# Patient Record
Sex: Female | Born: 1965 | Race: White | Hispanic: No | Marital: Married | Smoking: Former smoker
Health system: Southern US, Community
[De-identification: ages and names within clinical notes are randomized; demographics above are authoritative.]

## PROBLEM LIST (undated history)

## (undated) DIAGNOSIS — G4733 Obstructive sleep apnea (adult) (pediatric): Secondary | ICD-10-CM

## (undated) DIAGNOSIS — F329 Major depressive disorder, single episode, unspecified: Secondary | ICD-10-CM

## (undated) DIAGNOSIS — M25473 Effusion, unspecified ankle: Secondary | ICD-10-CM

## (undated) DIAGNOSIS — F32A Depression, unspecified: Secondary | ICD-10-CM

## (undated) DIAGNOSIS — F419 Anxiety disorder, unspecified: Secondary | ICD-10-CM

## (undated) DIAGNOSIS — Z87442 Personal history of urinary calculi: Secondary | ICD-10-CM

## (undated) DIAGNOSIS — K219 Gastro-esophageal reflux disease without esophagitis: Secondary | ICD-10-CM

## (undated) DIAGNOSIS — M810 Age-related osteoporosis without current pathological fracture: Secondary | ICD-10-CM

## (undated) DIAGNOSIS — E039 Hypothyroidism, unspecified: Secondary | ICD-10-CM

## (undated) DIAGNOSIS — I10 Essential (primary) hypertension: Secondary | ICD-10-CM

## (undated) DIAGNOSIS — E669 Obesity, unspecified: Secondary | ICD-10-CM

## (undated) HISTORY — PX: SALIVARY STONE REMOVAL: SHX5213

## (undated) HISTORY — PX: CHOLECYSTECTOMY: SHX55

## (undated) HISTORY — DX: Depression, unspecified: F32.A

## (undated) HISTORY — DX: Obesity, unspecified: E66.9

## (undated) HISTORY — DX: Essential (primary) hypertension: I10

## (undated) HISTORY — PX: DILATION AND CURETTAGE OF UTERUS: SHX78

## (undated) HISTORY — PX: DIAGNOSTIC LAPAROSCOPY: SUR761

## (undated) HISTORY — DX: Gastro-esophageal reflux disease without esophagitis: K21.9

## (undated) HISTORY — PX: TUBAL LIGATION: SHX77

---
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
## 2019-11-08 ENCOUNTER — Other Ambulatory Visit: Payer: Self-pay | Admitting: General Surgery

## 2019-11-08 ENCOUNTER — Other Ambulatory Visit (HOSPITAL_COMMUNITY): Payer: Self-pay | Admitting: General Surgery

## 2019-11-15 ENCOUNTER — Encounter: Payer: Self-pay | Admitting: Neurology

## 2019-11-15 ENCOUNTER — Other Ambulatory Visit: Payer: Self-pay

## 2019-11-15 ENCOUNTER — Ambulatory Visit: Payer: Commercial Managed Care - PPO | Admitting: Neurology

## 2019-11-15 VITALS — BP 134/86 | HR 75 | Temp 97.1°F | Ht 63.5 in | Wt 302.0 lb

## 2019-11-15 DIAGNOSIS — R351 Nocturia: Secondary | ICD-10-CM | POA: Diagnosis not present

## 2019-11-15 DIAGNOSIS — Z9189 Other specified personal risk factors, not elsewhere classified: Secondary | ICD-10-CM | POA: Diagnosis not present

## 2019-11-15 DIAGNOSIS — R0683 Snoring: Secondary | ICD-10-CM

## 2019-11-15 DIAGNOSIS — Z6841 Body Mass Index (BMI) 40.0 and over, adult: Secondary | ICD-10-CM

## 2019-11-15 NOTE — Progress Notes (Signed)
Subjective:    Patient ID: Heidi Pace is a 54 y.o. female.  HPI     Huston Foley, MD, PhD [facility] Heidi Pace [address] [address] [place], [place] [address]  Dear Dr. Sheliah Pace, I saw your patient, Heidi Pace, upon your kind request in my sleep clinic today for initial consultation of her sleep disorder, in particular, concern for underlying obstructive sleep apnea.  The patient is unaccompanied today.  As you know, Heidi Pace is a 54 year old right-handed woman with an underlying medical history of hypertension, arthritis, cholelithiasis with status post laparoscopic cholecystectomy, hypothyroidism, acid reflux disease, depression, and morbid obesity with a BMI of over 50, who reports snoring and excessive daytime somnolence.  She is being evaluated for bariatric surgery.  Her Epworth sleepiness score is 5 out of 24 today, fatigue severity score is 33 out of 63.  I reviewed your office note from 11/01/2019.  She is being evaluated for bariatric surgery.  She reports a bedtime between 1030 and 11, rise time around 630.  She works in the office, as a Occupational psychologist.  She quit smoking over 20 years ago and drinks alcohol in the form of wine, 1 or 2 times a week.  She does not drink daily caffeine.  She lives with her husband and her daughter.  They have 1 dog in the household, typically the dog sleeps on the bed with them, she does not have a TV in the bedroom.  She denies morning headaches, she denies a family history of sleep apnea, she has nocturia at least once per average night.  Weight has been plateaued.  She has not had a tonsillectomy and does not report any symptoms of restless leg syndrome.  She wakes up reasonably well rested she feels.  She does not typically take any naps except for very rarely.  Her Past Medical History Is Significant For: Past Medical History:  Diagnosis Date  . Acid reflux   . Depression   . Hypertension   .  Hypertension     Her Past Surgical History Is Significant For: Past Surgical History:  Procedure Laterality Date  . CESAREAN SECTION    . CHOLECYSTECTOMY    . DILATION AND CURETTAGE OF UTERUS    . SALIVARY STONE REMOVAL    . TUBAL LIGATION      Her Family History Is Significant For: No family history on file.  Her Social History Is Significant For: Social History   Socioeconomic History  . Marital status: Married    Spouse name: Trey Paula   . Number of children: 1  . Years of education: Not on file  . Highest education level: Associate degree: academic program  Occupational History  . Not on file  Tobacco Use  . Smoking status: Former Smoker    Types: Cigarettes    Quit date: 2000    Years since quitting: 21.1  . Smokeless tobacco: Never Used  Substance and Sexual Activity  . Alcohol use: Yes    Comment: Wine/2 glasses per week  . Drug use: Never  . Sexual activity: Not on file  Other Topics Concern  . Not on file  Social History Narrative  . Not on file   Social Determinants of Health   Financial Resource Strain:   . Difficulty of Paying Living Expenses: Not on file  Food Insecurity:   . Worried About Programme researcher, broadcasting/film/video in the Last Year: Not on file  . Ran Out of Food  in the Last Year: Not on file  Transportation Needs:   . Lack of Transportation (Medical): Not on file  . Lack of Transportation (Non-Medical): Not on file  Physical Activity:   . Days of Exercise per Week: Not on file  . Minutes of Exercise per Session: Not on file  Stress:   . Feeling of Stress : Not on file  Social Connections:   . Frequency of Communication with Friends and Family: Not on file  . Frequency of Social Gatherings with Friends and Family: Not on file  . Attends Religious Services: Not on file  . Active Member of Clubs or Organizations: Not on file  . Attends Banker Meetings: Not on file  . Marital Status: Not on file    Her Allergies Are:  No Known  Allergies:   Her Current Medications Are:  Outpatient Encounter Medications as of 11/15/2019  Medication Sig  . Ascorbic Acid (VITAMIN C PO) Take by mouth.  . ASPIRIN 81 PO Take by mouth.  Marland Kitchen BIOTIN PO Take by mouth.  Marland Kitchen buPROPion (WELLBUTRIN XL) 150 MG 24 hr tablet Take 150 mg by mouth daily.  . Calcium Carb-Cholecalciferol (CALCIUM/VITAMIN D PO) Take 600 mg by mouth.  Marland Kitchen FLUoxetine (PROZAC) 20 MG capsule Take 20 mg by mouth daily.  Marland Kitchen levothyroxine (SYNTHROID) 25 MCG tablet Take 25 mcg by mouth daily.  Marland Kitchen lisinopril-hydrochlorothiazide (ZESTORETIC) 10-12.5 MG tablet Take 1 tablet by mouth daily.  . Multiple Vitamin (MULTIVITAMIN) tablet Take 1 tablet by mouth daily.  Marland Kitchen omeprazole (PRILOSEC) 20 MG capsule Take 20 mg by mouth daily.   No facility-administered encounter medications on file as of 11/15/2019.  :  Review of Systems:  Out of a complete 14 point review of systems, all are reviewed and negative with the exception of these symptoms as listed below: Review of Systems  Neurological:       Here for sleep consult. No prior sleep study, snoring is present.   Epworth Sleepiness Scale 0= would never doze 1= slight chance of dozing 2= moderate chance of dozing 3= high chance of dozing  Sitting and reading:0 Watching TV:1 Sitting inactive in a public place (ex. Theater or meeting):0 As a passenger in a car for an hour without a break:1 Lying down to rest in the afternoon:3 Sitting and talking to someone:0 Sitting quietly after lunch (no alcohol):0 In a car, while stopped in traffic:0 Total:5     Objective:  Neurological Exam  Physical Exam Physical Examination:   Vitals:   11/15/19 0852  BP: 134/86  Pulse: 75  Temp: (!) 97.1 F (36.2 C)  SpO2: 98%    General Examination: The patient is a very pleasant 54 y.o. female in no acute distress. She appears well-developed and well-nourished and well groomed.   HEENT: Normocephalic, atraumatic, pupils are equal, round and  reactive to light, extraocular tracking is good without limitation to gaze excursion or nystagmus noted. Hearing is grossly intact. Face is symmetric with normal facial animation. Speech is clear with no dysarthria noted. There is no hypophonia. There is no lip, neck/head, jaw or voice tremor. Neck is supple with full range of passive and active motion. There are no carotid bruits on auscultation. Oropharynx exam reveals: mild mouth dryness, adequate dental hygiene and mild airway crowding, due to Smaller airway entry, tonsils are on the smaller side, uvula smaller as well, Mallampati class II, neck circumference 16-1/8 inches.  Tongue protrudes centrally in palate elevates symmetrically.  She has a mild  overbite.  Chest: Clear to auscultation without wheezing, rhonchi or crackles noted.  Heart: S1+S2+0, regular and normal without murmurs, rubs or gallops noted.   Abdomen: Soft, non-tender and non-distended with normal bowel sounds appreciated on auscultation.  Extremities: There is trace pitting L leg.   Skin: Warm and dry without trophic changes noted.   Musculoskeletal: exam reveals L knee pain.   Neurologically:  Mental status: The patient is awake, alert and oriented in all 4 spheres. Her immediate and remote memory, attention, language skills and fund of knowledge are appropriate. There is no evidence of aphasia, agnosia, apraxia or anomia. Speech is clear with normal prosody and enunciation. Thought process is linear. Mood is normal and affect is normal.  Cranial nerves II - XII are as described above under HEENT exam.  Motor exam: Normal bulk, strength and tone is noted. There is no tremor, Romberg is negative. Reflexes are 1+ throughout, toes are down going b/l. Fine motor skills and coordination: grossly intact.  Cerebellar testing: No dysmetria or intention tremor. There is no truncal or gait ataxia.  Sensory exam: intact to light touch in the upper and lower extremities.  Gait,  station and balance: She stands easily. No veering to one side is noted. No leaning to one side is noted. Posture is age-appropriate and stance is narrow based. Gait shows normal stride length and normal pace. No problems turning are noted. L side limp.   Assessment and Plan:  In summary, CERIA SUMINSKI is a very pleasant 54 y.o.-year old female with an underlying medical history of hypertension, arthritis, cholelithiasis with status post laparoscopic cholecystectomy, hypothyroidism, acid reflux disease, depression, and morbid obesity with a BMI of over 50, whose history and physical exam are concerning for obstructive sleep apnea (OSA). I had a long chat with the patient about my findings and the diagnosis of OSA, its prognosis and treatment options. We talked about medical treatments, surgical interventions and non-pharmacological approaches. I explained in particular the risks and ramifications of untreated moderate to severe OSA, especially with respect to developing cardiovascular disease down the Road, including congestive heart failure, difficult to treat hypertension, cardiac arrhythmias, or stroke. Even type 2 diabetes has, in part, been linked to untreated OSA. Symptoms of untreated OSA include daytime sleepiness, memory problems, mood irritability and mood disorder such as depression and anxiety, lack of energy, as well as recurrent headaches, especially morning headaches. We talked about trying to maintain a healthy lifestyle in general, as well as the importance of weight control. We also talked about the importance of good sleep hygiene. I recommended the following at this time: sleep study.   I explained the sleep test procedure to the patient and also outlined possible surgical and non-surgical treatment options of OSA, including the use of a custom-made dental device (which would require a referral to a specialist dentist or oral surgeon), upper airway surgical options, such as traditional  UPPP or a novel less invasive surgical option in the form of Inspire hypoglossal nerve stimulation (which would involve a referral to an ENT surgeon). I also explained the CPAP or autoPAP treatment option to the patient, who indicated that she would be willing to try CPAP if the need arises. I explained the importance of being compliant with PAP treatment, not only for insurance purposes but primarily to improve Her symptoms, and for the patient's long term health benefit, including to reduce Her cardiovascular risks. I answered all her questions today and the patient was in agreement. I  plan to see her back after the sleep study is completed and encouraged her to call with any interim questions, concerns, problems or updates.   Thank you very much for allowing me to participate in the care of this nice patient. If I can be of any further assistance to you please do not hesitate to call me at [phone].  Sincerely,   Huston Foley, MD, PhD

## 2019-11-15 NOTE — Patient Instructions (Signed)

## 2019-11-27 ENCOUNTER — Other Ambulatory Visit: Payer: Self-pay | Admitting: General Surgery

## 2019-11-27 ENCOUNTER — Other Ambulatory Visit (HOSPITAL_COMMUNITY): Payer: Self-pay | Admitting: General Surgery

## 2019-11-27 ENCOUNTER — Ambulatory Visit (INDEPENDENT_AMBULATORY_CARE_PROVIDER_SITE_OTHER): Payer: Commercial Managed Care - PPO | Admitting: Neurology

## 2019-11-27 DIAGNOSIS — R351 Nocturia: Secondary | ICD-10-CM

## 2019-11-27 DIAGNOSIS — Z9189 Other specified personal risk factors, not elsewhere classified: Secondary | ICD-10-CM

## 2019-11-27 DIAGNOSIS — R0683 Snoring: Secondary | ICD-10-CM

## 2019-11-27 DIAGNOSIS — G4720 Circadian rhythm sleep disorder, unspecified type: Secondary | ICD-10-CM

## 2019-11-27 DIAGNOSIS — G4733 Obstructive sleep apnea (adult) (pediatric): Secondary | ICD-10-CM

## 2019-11-30 ENCOUNTER — Encounter: Payer: Self-pay | Admitting: Dietician

## 2019-11-30 ENCOUNTER — Encounter: Payer: Commercial Managed Care - PPO | Attending: General Surgery | Admitting: Dietician

## 2019-11-30 ENCOUNTER — Other Ambulatory Visit: Payer: Self-pay

## 2019-11-30 DIAGNOSIS — E669 Obesity, unspecified: Secondary | ICD-10-CM | POA: Diagnosis present

## 2019-11-30 NOTE — Progress Notes (Signed)
Nutrition Assessment for Bariatric Surgery Medical Nutrition Therapy  Appt Start Time: 9:45am    End Time: 10:45am  Patient was seen on 11/30/2019 for Pre-Operative Nutrition Assessment. Letter of approval faxed to [facility] bariatric surgery program coordinator on 11/30/2019.   Referral stated Supervised Weight Loss (SWL) visits needed: 0  Planned surgery: RYGB Pt expectation of surgery: to lose enough weight to qualify for knee surgery, to be healthier  Pt expectation of dietitian: to learn how to eat healty   NUTRITION ASSESSMENT   Anthropometrics  Start weight at [facility]: 299 lbs (date: 11/30/2019) Height: 63.5 in BMI: 52.1 kg/m2     Lifestyle & Dietary Hx Patient lives with her husband and daughter. Daughter has egg and nut allergies, will keep nuts out of the house. Typical meal pattern is 3 meals plus maybe 1 snack per day. Will pack Lean Cuisine for lunch daily at work. May have a sandwich for lunch if at home. Will have fish with mac & cheese and baked beans, or maybe tacos for a meal. Eat out 0-2 times per week.     24-Hr Dietary Recall First Meal: Dannon light and fit yogurt + [place] Oats & Honey granola  Snack: - Second Meal: Lean Cuisine spaghetti + orange Snack: oatmeal cream pie  Third Meal: tacos (meat, lettuce, tomato, taco shells, cheese)  Snack: -  Beverages: water  Estimated Energy Needs Calories: 1500 Carbohydrate: 170g Protein: 94g Fat: 50g   NUTRITION DIAGNOSIS  Overweight/obesity (Branch-3.3) related to past poor dietary habits and physical inactivity as evidenced by patient w/ planned RYGB surgery following dietary guidelines for continued weight loss.    NUTRITION INTERVENTION  Nutrition counseling (C-1) and education (E-2) to facilitate bariatric surgery goals.  Pre-Op Goals Reviewed with the Patient . Track food and beverage intake (pen and paper, MyFitness Pal, Baritastic app, etc.) . Make healthy food choices while monitoring  portion sizes . Consume 3 meals per day or try to eat every 3-5 hours . Avoid concentrated sugars and fried foods . Keep sugar & fat in the single digits per serving on food labels . Practice CHEWING your food (aim for applesauce consistency) . Practice not drinking 15 minutes before, during, and 30 minutes after each meal and snack . Avoid all carbonated beverages (ex: soda, sparkling beverages)  . Limit caffeinated beverages (ex: coffee, tea, energy drinks) . Avoid all sugar-sweetened beverages (ex: regular soda, sports drinks)  . Avoid alcohol  . Aim for 64-100 ounces of FLUID daily (with at least half of fluid intake being plain water)  . Aim for at least 60-80 grams of PROTEIN daily . Look for a liquid protein source that contains ?15 g protein and ?5 g carbohydrate (ex: shakes, drinks, shots) . Make a list of non-food related activities . Physical activity is an important part of a healthy lifestyle so keep it moving! The goal is to reach 150 minutes of exercise per week, including cardiovascular and weight baring activity.  *Goals that are bolded indicate the pt would like to start working towards these  Handouts Provided Include  . Bariatric Surgery handouts (Nutrition Visits, Pre-Op Goals, Protein Shakes, Vitamins & Minerals)  Learning Style & Readiness for Change Teaching method utilized: Visual & Auditory  Demonstrated degree of understanding via: Teach Back  Barriers to learning/adherence to lifestyle change: None Identified    MONITORING & EVALUATION Dietary intake, weekly physical activity, body weight, and pre-op goals reached at next nutrition visit.   Next Steps Patient  is to follow up at [place] for Pre-Op Class (>2 weeks before surgery) for further nutrition education.

## 2019-12-05 ENCOUNTER — Ambulatory Visit (HOSPITAL_COMMUNITY)
Admission: RE | Admit: 2019-12-05 | Discharge: 2019-12-05 | Disposition: A | Discharge status: Home / Self Care | Payer: Commercial Managed Care - PPO | Source: Ambulatory Visit | Attending: General Surgery | Admitting: General Surgery

## 2019-12-05 ENCOUNTER — Other Ambulatory Visit: Payer: Self-pay

## 2019-12-08 NOTE — Procedures (Signed)
PATIENT'S NAME:  Heidi Pace, Heidi Pace DOB:      September 24, 1966      MR#:    [id number]     DATE OF RECORDING: 11/27/2019 REFERRING M.D.:  De Blanch Kinsinger, MD Study Performed:   Baseline Polysomnogram HISTORY: 54 year old woman with a history of hypertension, arthritis, cholelithiasis with status post laparoscopic cholecystectomy, hypothyroidism, acid reflux disease, depression, and morbid obesity with a BMI of over 50, who reports snoring and excessive daytime somnolence.  She is being evaluated for bariatric surgery. The patient endorsed the Epworth Sleepiness Scale at 5/24 points. The patient's weight 302 pounds with a height of 64 (inches), resulting in a BMI of 52.9 kg/m2. The patient's neck circumference measured 16.2 inches.  CURRENT MEDICATIONS: Vitamin C, Aspirin, Biotin, Wellbutrin XL, cholecalciferol, Prozac, Synthroid, Zestoretic, Multivitamin, Prilosec.   PROCEDURE:  This is a multichannel digital polysomnogram utilizing the Somnostar 11.2 system.  Electrodes and sensors were applied and monitored per AASM Specifications.   EEG, EOG, Chin and Limb EMG, were sampled at 200 Hz.  ECG, Snore and Nasal Pressure, Thermal Airflow, Respiratory Effort, CPAP Flow and Pressure, Oximetry was sampled at 50 Hz. Digital video and audio were recorded.      BASELINE STUDY  Lights Out was at 21:55 and Lights On at 05:03.  Total recording time (TRT) was 428.5 minutes, with a total sleep time (TST) of 353 minutes.   The patient's sleep latency to persistent sleep was 56.5 minutes, which is delayed.  REM latency was 333 minutes, which is markedly delayed. The sleep efficiency was 82.4 %.     SLEEP ARCHITECTURE: WASO (Wake after sleep onset) was 47.5 minutes with mild sleep fragmentation noted.  There were 7.5 minutes in Stage N1, 181 minutes Stage N2, 100.5 minutes Stage N3 and 64 minutes in Stage REM.  The percentage of Stage N1 was 2.1%, Stage N2 was 51.3%, which is normal, Stage N3 was 28.5%, which is  increased, and Stage R (REM sleep) was 18.1%, which is near normal. The arousals were noted as: 38 were spontaneous, 13 were associated with PLMs, 14 were associated with respiratory events.  RESPIRATORY ANALYSIS:  There were a total of 69 respiratory events:  1 obstructive apneas, 1 central apneas and 0 mixed apneas with a total of 2 apneas and an apnea index (AI) of .3 /hour. There were 67 hypopneas with a hypopnea index of 11.4 /hour. The patient also had 0 respiratory event related arousals (RERAs).      The total APNEA/HYPOPNEA INDEX (AHI) was 11.7/hour and the total RESPIRATORY DISTURBANCE INDEX was  11.7 /hour.  53 events occurred in REM sleep and 30 events in NREM. The REM AHI was  49.7 /hour, versus a non-REM AHI of 3.3. The patient spent 90 minutes of total sleep time in the supine position and 263 minutes in non-supine.. The supine AHI was 6.0 versus a non-supine AHI of 13.7.  OXYGEN SATURATION & C02:  The Wake baseline 02 saturation was 92%, with the lowest being 79%. Time spent below 89% saturation equaled 136 minutes. PERIODIC LIMB MOVEMENTS:   The patient had a total of 35 Periodic Limb Movements.  The Periodic Limb Movement (PLM) index was 5.9 and the PLM Arousal index was 2.2/hour.  Audio and video analysis did not show any abnormal or unusual movements, behaviors, phonations or vocalizations. The patient took no bathroom breaks. Mild to moderate snoring was noted. The EKG was in keeping with normal sinus rhythm (NSR).  Post-study, the patient indicated that sleep  was the same as usual.   IMPRESSION:  1. Obstructive Sleep Apnea (OSA) 2. Dysfunctions associated with sleep stages or arousal from sleep  RECOMMENDATIONS:  1. This study demonstrates overall mild obstructive sleep apnea, severe in REM sleep with a total AHI of 11.7/hour, REM AHI of 49.7/hour, and O2 nadir of 79%. The absence of supine REM sleep may underestimate her AHI and O2 nadir. Given the patient's medical  history and sleep related complaints, as well as planned bariatric surgery, treatment with positive airway pressure is recommended; this can be achieved in the form of autoPAP. Alternatively, a full-night CPAP titration study would allow optimization of therapy if needed. Other treatment options may include avoidance of supine sleep position along with weight loss, upper airway or jaw surgery in selected patients or the use of an oral appliance in certain patients. ENT evaluation and/or consultation with a maxillofacial surgeon or dentist may be feasible in some instances.    2. Please note that untreated obstructive sleep apnea may carry additional perioperative morbidity. Patients with significant obstructive sleep apnea should receive perioperative PAP therapy and the surgeons and particularly the anesthesiologist should be informed of the diagnosis and the severity of the sleep disordered breathing. 3. This study shows mild sleep fragmentation and mildly abnormal sleep stage percentages; these are nonspecific findings and per se do not signify an intrinsic sleep disorder or a cause for the patient's sleep-related symptoms. Causes include (but are not limited to) the first night effect of the sleep study, circadian rhythm disturbances, medication effect or an underlying mood disorder or medical problem.  4. The patient should be cautioned not to drive, work at heights, or operate dangerous or heavy equipment when tired or sleepy. Review and reiteration of good sleep hygiene measures should be pursued with any patient. 5. The patient will be seen in follow-up by Dr. Rexene Alberts at [facility] for discussion of the test results and further management strategies. The referring provider will be notified of the test results.  I certify that I have reviewed the entire raw data recording prior to the issuance of this report in accordance with the Standards of Accreditation of the American Academy of Sleep Medicine  (AASM)  Star Age, MD, PhD Diplomat, American Board of Neurology and Sleep Medicine (Neurology and Sleep Medicine)

## 2019-12-08 NOTE — Progress Notes (Signed)
Patient referred by Dr. Sheliah Hatch, seen by me on 11/15/19, PSG on 11/27/19.    Please call and notify the patient that the recent sleep study showed obstructive sleep apnea. OSA is overall mild, but in the severe range when she is in REM/dream sleep. In light of planned weight loss surgery, treatment with a positive airway pressure machine is a reasonable choice and may be temporary in her case (may not need treatment after successful weight loss with time). To that end, I recommend treatment of her OSA in the form of autoPAP, which means, that we don't have to bring her in for a sleep study with CPAP, but will let her try an autoPAP machine at home, through a DME company (of her choice, or as per insurance requirement). The DME representative will educate her on how to use the machine, how to put the mask on, etc. I have placed an order in the chart. Please remind patient about the importance of compliance with treatment (which is an insurance requirement), the need for a timely follow-up appointment, and the importance of cleanliness with the device and its parts (the DME representative will be able to explain and answer questions).  Please send referral, talk to patient, send report to referring MD. We will need a FU in sleep clinic for 10 weeks post-PAP set up, please arrange that with me or one of our NPs. Thanks,   Huston Foley, MD, PhD [facility])

## 2019-12-08 NOTE — Addendum Note (Signed)
Addended by: Huston Foley on: 12/08/2019 01:19 PM   Modules accepted: Orders

## 2019-12-11 ENCOUNTER — Telehealth: Payer: Self-pay

## 2019-12-11 NOTE — Telephone Encounter (Signed)
-----   Message from Huston Foley, MD sent at 12/08/2019  1:19 PM EST ----- Patient referred by Dr. Sheliah Hatch, seen by me on 11/15/19, PSG on 11/27/19.    Please call and notify the patient that the recent sleep study showed obstructive sleep apnea. OSA is overall mild, but in the severe range when she is in REM/dream sleep. In light of planned weight loss surgery, treatment with a positive airway pressure machine is a reasonable choice and may be temporary in her case (may not need treatment after successful weight loss with time). To that end, I recommend treatment of her OSA in the form of autoPAP, which means, that we don't have to bring her in for a sleep study with CPAP, but will let her try an autoPAP machine at home, through a DME company (of her choice, or as per insurance requirement). The DME representative will educate her on how to use the machine, how to put the mask on, etc. I have placed an order in the chart. Please remind patient about the importance of compliance with treatment (which is an insurance requirement), the need for a timely follow-up appointment, and the importance of cleanliness with the device and its parts (the DME representative will be able to explain and answer questions).  Please send referral, talk to patient, send report to referring MD. We will need a FU in sleep clinic for 10 weeks post-PAP set up, please arrange that with me or one of our NPs. Thanks,   Huston Foley, MD, PhD [facility])

## 2019-12-11 NOTE — Telephone Encounter (Signed)
I attempted to reach the pt to discuss sleep study results. Pt was not available and did not have a working Engineer, technical sales. Will try again at a later time.

## 2019-12-13 NOTE — Telephone Encounter (Signed)
I attempted to reach the pt to discuss sleep study results. Pt was not available and did not have a working Engineer, technical sales. I have sent a letter to the pt asking him to call back so we can review this result.

## 2019-12-18 ENCOUNTER — Encounter: Payer: Self-pay | Admitting: Neurology

## 2019-12-26 NOTE — Telephone Encounter (Signed)
I called pt. I advised pt that Dr. Frances Furbish reviewed their sleep study results and found that pt has sleep apnea. Dr. Frances Furbish recommends that pt starts auto CPAP. I reviewed PAP compliance expectations with the pt. Pt is agreeable to starting a CPAP. I advised pt that an order will be sent to a DME, [facility], and [facility] will call the pt within about one week after they file with the pt's insurance. [facility] will show the pt how to use the machine, fit for masks, and troubleshoot the CPAP if needed. A follow up appt was made for insurance purposes with Shawnie Dapper, NP on June 2,2021 at 8:00 am. Pt verbalized understanding to arrive 15 minutes early and bring their CPAP. A letter with all of this information in it will be mailed to the pt as a reminder. I verified with the pt that the address we have on file is correct. Pt verbalized understanding of results. Pt had no questions at this time but was encouraged to call back if questions arise. I have sent the order to [facility] and have received confirmation that they have received the order.

## 2019-12-26 NOTE — Telephone Encounter (Signed)
Pt returned call. Please call back when available. [phone]

## 2020-01-09 NOTE — Telephone Encounter (Signed)
Rep with [employer] called to inform fax form [facility] detailed written order PAP and supplies were illegible and it looks like two pages may have gotten stuck together and would like to know if this page could be refaxed.   Fax# [phone] Phone# [phone]

## 2020-01-09 NOTE — Telephone Encounter (Signed)
Re-faxed per request. Received a receipt of confirmation.

## 2020-02-12 ENCOUNTER — Encounter: Payer: Commercial Managed Care - PPO | Attending: General Surgery | Admitting: Skilled Nursing Facility1

## 2020-02-12 ENCOUNTER — Other Ambulatory Visit: Payer: Self-pay

## 2020-02-12 DIAGNOSIS — E669 Obesity, unspecified: Secondary | ICD-10-CM | POA: Diagnosis present

## 2020-02-12 NOTE — Progress Notes (Signed)
Pre-Operative Nutrition Class:  Appt start time: 1730   End time:  1830.  Patient was seen on 02/12/2020 for Pre-Operative Bariatric Surgery Education at [facility].    Surgery date:  Surgery type: RYGB Start weight at [facility]: 299 Weight today: 307.4    The following the learning objectives were met by the patient during this course:  Identify Pre-Op Dietary Goals and will begin 2 weeks pre-operatively  Identify appropriate sources of fluids and proteins   State protein recommendations and appropriate sources pre and post-operatively  Identify Post-Operative Dietary Goals and will follow for 2 weeks post-operatively  Identify appropriate multivitamin and calcium sources  Describe the need for physical activity post-operatively and will follow MD recommendations  State when to call healthcare provider regarding medication questions or post-operative complications  Handouts given during class include:  Pre-Op Bariatric Surgery Diet Handout  Protein Shake Handout  Post-Op Bariatric Surgery Nutrition Handout  [facility]EL[facility] Program Information Flyer  Support Group Information Flyer  [facility] Bariatric Supplements Price List  Follow-Up Plan: Patient will follow-up at [facility] 2 weeks post operatively for diet advancement per MD.   

## 2020-02-14 ENCOUNTER — Encounter (HOSPITAL_COMMUNITY): Payer: Self-pay

## 2020-02-14 ENCOUNTER — Ambulatory Visit: Payer: Self-pay | Admitting: General Surgery

## 2020-02-14 NOTE — Patient Instructions (Addendum)
DUE TO COVID-19 ONLY ONE VISITOR ARE ALLOWED TO COME WITH YOU AND STAY IN THE WAITING ROOM ONLY DURING PRE OP AND PROCEDURE. THEN TWO VISITORS MAY VISIT WITH YOU IN YOUR PRIVATE ROOM DURING VISITING HOURS ONLY!!   COVID SWAB TESTING MUST BE COMPLETED ON:  Thursday, Feb 22, 2020 at 2:30 PM  [address], [place] [facility] enter pre surgical testing line (Must self quarantine after testing. Follow instructions on handout.)             Your procedure is scheduled on: Monday, Feb 26, 2020   Report to [facility] Main  Entrance   Report to Short Stay at 5:30 AM   [facility])   Call this number if you have problems the morning of surgery [phone]   Bring CPAP mask and tubing day of surgery   NO SOLID FOOD AFTER 600 PM THE NIGHT BEFORE YOUR SURGERY.    YOU MAY DRINK CLEAR FLUIDS UNTIL 4:15 AM DAY OF SURGERY   CLEAR LIQUID DIET  Foods Allowed                                                                     Foods Excluded  Water, Black Coffee and tea, regular and decaf                             liquids that you cannot  Plain Jell-O in any flavor  (No red)                                           see through such as: Fruit ices (not with fruit pulp)                                     milk, soups, orange juice  Iced Popsicles (No red)                                    All solid food Carbonated beverages, regular and diet                                    Apple juices Sports drinks like Gatorade (No red) Lightly seasoned clear broth or consume(fat free) Sugar, honey syrup  Sample Menu Breakfast                                Lunch                                     Supper Cranberry juice                    Beef broth  Chicken broth Jell-O                                     Grape juice                           Apple juice Coffee or tea                        Jell-O                                       Popsicle                                                Coffee or tea                        Coffee or tea     MORNING OF SURGERY DRINK:   DRINK 1 G2 drink BEFORE YOU LEAVE HOME, DRINK ALL OF THE  G2 DRINK AT ONE TIME.    THE G2 DRINK WILL BE THE LAST FLUIDS YOU DRINK BEFORE SURGERY.   Oral Hygiene is also important to reduce your risk of infection.                                    Remember - BRUSH YOUR TEETH THE MORNING OF SURGERY WITH YOUR REGULAR TOOTHPASTE   Do NOT smoke after Midnight   Take these medicines the morning of surgery with A SIP OF WATER: Bupropion, Fluoxetine, Levothyroxine, Omeprazole                               You may not have any metal on your body including hair pins, jewelry, and body piercings             Do not wear make-up, lotions, powders, perfumes/cologne, or deodorant             Do not wear nail polish.  Do not shave  48 hours prior to surgery.                Do not bring valuables to the hospital. [place].   Contacts, dentures or bridgework may not be worn into surgery.   Bring small overnight bag day of surgery.   PAIN IS EXPECTED AFTER SURGERY AND WILL NOT BE COMPLETELY ELIMINATED. AMBULATION AND TYLENOL WILL HELP REDUCE INCISIONAL AND GAS PAIN. MOVEMENT IS KEY!   YOU ARE EXPECTED TO BE OUT OF BED WITHIN 4 HOURS OF ADMISSION TO YOUR PATIENT ROOM.   SITTING IN THE RECLINER THROUGHOUT THE DAY IS IMPORTANT FOR DRINKING FLUIDS AND MOVING GAS THROUGHOUT THE GI TRACT.   COMPRESSION STOCKINGS SHOULD BE WORN [place] UNLESS YOU ARE WALKING.    INCENTIVE SPIROMETER SHOULD BE USED EVERY HOUR WHILE AWAKE TO DECREASE POST-OPERATIVE COMPLICATIONS SUCH AS PNEUMONIA.  WHEN DISCHARGED HOME, IT IS IMPORTANT TO CONTINUE TO WALK EVERY HOUR AND USE THE INCENTIVE SPIROMETER EVERY HOUR.      Special Instructions: Bring a copy of your healthcare power of attorney and living will documents          the day of surgery if you haven't scanned them in before.              Please read over the following fact sheets you were given: IF YOU HAVE QUESTIONS ABOUT YOUR PRE OP INSTRUCTIONS PLEASE CALL [phone]    [facility] - Preparing for Surgery Before surgery, you can play an important role.  Because skin is not sterile, your skin needs to be as free of germs as possible.  You can reduce the number of germs on your skin by washing with CHG (chlorahexidine gluconate) soap before surgery.  CHG is an antiseptic cleaner which kills germs and bonds with the skin to continue killing germs even after washing. Please DO NOT use if you have an allergy to CHG or antibacterial soaps.  If your skin becomes reddened/irritated stop using the CHG and inform your nurse when you arrive at Short Stay. Do not shave (including legs and underarms) for at least 48 hours prior to the first CHG shower.  You may shave your face/neck.  Please follow these instructions carefully:  1.  Shower with CHG Soap the night before surgery and the  morning of surgery.  2.  If you choose to wash your hair, wash your hair first as usual with your normal  shampoo.  3.  After you shampoo, rinse your hair and body thoroughly to remove the shampoo.                             4.  Use CHG as you would any other liquid soap.  You can apply chg directly to the skin and wash.  Gently with a scrungie or clean washcloth.  5.  Apply the CHG Soap to your body ONLY FROM THE NECK DOWN.   Do   not use on face/ open                           Wound or open sores. Avoid contact with eyes, ears mouth and   genitals (private parts).                       Wash face,  Genitals (private parts) with your normal soap.             6.  Wash thoroughly, paying special attention to the area where your    surgery  will be performed.  7.  Thoroughly rinse your body with warm water from the neck down.  8.  DO NOT shower/wash with your normal soap after using  and rinsing off the CHG Soap.                9.  Pat yourself dry with a clean towel.            10.  Wear clean pajamas.            11.  Place clean sheets on your bed the night of your first shower and do not  sleep with pets. Day of Surgery : Do not apply any lotions/deodorants the morning of surgery.  Please wear clean clothes to  the hospital/surgery center.  FAILURE TO FOLLOW THESE INSTRUCTIONS MAY RESULT IN THE CANCELLATION OF YOUR SURGERY  PATIENT SIGNATURE_________________________________  NURSE SIGNATURE__________________________________  ________________________________________________________________________

## 2020-02-19 ENCOUNTER — Other Ambulatory Visit: Payer: Self-pay

## 2020-02-19 ENCOUNTER — Encounter (HOSPITAL_COMMUNITY): Payer: Self-pay

## 2020-02-19 ENCOUNTER — Encounter (HOSPITAL_COMMUNITY): Payer: Commercial Managed Care - PPO

## 2020-02-19 ENCOUNTER — Encounter (HOSPITAL_COMMUNITY)
Admission: RE | Admit: 2020-02-19 | Discharge: 2020-02-19 | Disposition: A | Discharge status: Home / Self Care | Payer: Commercial Managed Care - PPO | Source: Ambulatory Visit | Attending: General Surgery | Admitting: General Surgery

## 2020-02-19 HISTORY — DX: Anxiety disorder, unspecified: F41.9

## 2020-02-19 HISTORY — DX: Obstructive sleep apnea (adult) (pediatric): G47.33

## 2020-02-19 HISTORY — DX: Personal history of urinary calculi: Z87.442

## 2020-02-19 HISTORY — DX: Hypothyroidism, unspecified: E03.9

## 2020-02-19 HISTORY — DX: Effusion, unspecified ankle: M25.473

## 2020-02-19 HISTORY — DX: Age-related osteoporosis without current pathological fracture: M81.0

## 2020-02-19 NOTE — Progress Notes (Signed)
PCP - Dr. Fanny Dance Cardiologist - N/A  Chest x-ray - 12/05/19 in epic EKG - 12/05/19 in epic Stress Test - N/A ECHO - N/A Cardiac Cath - N/A  Sleep Study - 11/27/19 in epic CPAP - Yes  Fasting Blood Sugar - N/A Checks Blood Sugar _N/A____ times a day  Blood Thinner Instructions: N/A Aspirin Instructions: Yes Last Dose: 02/17/20  Anesthesia review: BMI 51.4, HTN, OSA  Patient denies shortness of breath, fever, cough and chest pain at PAT appointment   Patient verbalized understanding of instructions that were given to them at the PAT appointment. Patient was also instructed that they will need to review over the PAT instructions again at home before surgery.

## 2020-02-22 ENCOUNTER — Other Ambulatory Visit (HOSPITAL_COMMUNITY)
Admission: RE | Admit: 2020-02-22 | Discharge: 2020-02-22 | Disposition: A | Discharge status: Home / Self Care | Payer: Commercial Managed Care - PPO | Source: Ambulatory Visit | Attending: General Surgery | Admitting: General Surgery

## 2020-02-22 ENCOUNTER — Other Ambulatory Visit: Payer: Self-pay

## 2020-02-22 ENCOUNTER — Encounter (HOSPITAL_COMMUNITY)
Admission: RE | Admit: 2020-02-22 | Discharge: 2020-02-22 | Disposition: A | Discharge status: Home / Self Care | Payer: Commercial Managed Care - PPO | Source: Ambulatory Visit | Attending: General Surgery | Admitting: General Surgery

## 2020-02-22 DIAGNOSIS — Z20822 Contact with and (suspected) exposure to covid-19: Secondary | ICD-10-CM | POA: Diagnosis not present

## 2020-02-22 DIAGNOSIS — Z01812 Encounter for preprocedural laboratory examination: Secondary | ICD-10-CM | POA: Diagnosis present

## 2020-02-22 LAB — CBC WITH DIFFERENTIAL/PLATELET
Abs Immature Granulocytes: 0.02 10*3/uL (ref 0.00–0.07)
Basophils Absolute: 0.1 10*3/uL (ref 0.0–0.1)
Basophils Relative: 1 %
Eosinophils Absolute: 0.2 10*3/uL (ref 0.0–0.5)
Eosinophils Relative: 3 %
HCT: 45.2 % (ref 36.0–46.0)
Hemoglobin: 14.6 g/dL (ref 12.0–15.0)
Immature Granulocytes: 0 %
Lymphocytes Relative: 22 %
Lymphs Abs: 2 10*3/uL (ref 0.7–4.0)
MCH: 30.7 pg (ref 26.0–34.0)
MCHC: 32.3 g/dL (ref 30.0–36.0)
MCV: 95.2 fL (ref 80.0–100.0)
Monocytes Absolute: 0.8 10*3/uL (ref 0.1–1.0)
Monocytes Relative: 9 %
Neutro Abs: 5.8 10*3/uL (ref 1.7–7.7)
Neutrophils Relative %: 65 %
Platelets: 277 10*3/uL (ref 150–400)
RBC: 4.75 MIL/uL (ref 3.87–5.11)
RDW: 12.1 % (ref 11.5–15.5)
WBC: 8.9 10*3/uL (ref 4.0–10.5)
nRBC: 0 % (ref 0.0–0.2)

## 2020-02-22 LAB — COMPREHENSIVE METABOLIC PANEL
ALT: 17 U/L (ref 0–44)
AST: 18 U/L (ref 15–41)
Albumin: 4.3 g/dL (ref 3.5–5.0)
Alkaline Phosphatase: 60 U/L (ref 38–126)
Anion gap: 10 (ref 5–15)
BUN: 27 mg/dL — ABNORMAL HIGH (ref 6–20)
CO2: 28 mmol/L (ref 22–32)
Calcium: 9.1 mg/dL (ref 8.9–10.3)
Chloride: 102 mmol/L (ref 98–111)
Creatinine, Ser: 0.87 mg/dL (ref 0.44–1.00)
GFR calc Af Amer: >60 mL/min (ref >60)
GFR calc non Af Amer: >60 mL/min (ref >60)
Glucose, Bld: 92 mg/dL (ref 70–99)
Potassium: 4.7 mmol/L (ref 3.5–5.1)
Sodium: 140 mmol/L (ref 135–145)
Total Bilirubin: 0.7 mg/dL (ref 0.3–1.2)
Total Protein: 7.6 g/dL (ref 6.5–8.1)

## 2020-02-23 LAB — SARS CORONAVIRUS 2 (TAT 6-24 HRS): SARS Coronavirus 2: NEGATIVE

## 2020-02-23 LAB — ABO/RH: ABO/RH(D): B POS

## 2020-02-25 MED ORDER — BUPIVACAINE LIPOSOME 1.3 % IJ SUSP
20.0000 mL | INTRAMUSCULAR | Status: DC
  Filled 2020-02-25: qty 20

## 2020-02-25 NOTE — Anesthesia Preprocedure Evaluation (Addendum)
Anesthesia Evaluation  Patient identified by MRN, date of birth, ID band Patient awake    Reviewed: Allergy & Precautions, NPO status , Patient's Chart, lab work & pertinent test results  History of Anesthesia Complications Negative for: history of anesthetic complications  Airway Mallampati: II  TM Distance: >3 FB Neck ROM: Full    Dental  (+) Dental Advisory Given, Teeth Intact   Pulmonary sleep apnea and Continuous Positive Airway Pressure Ventilation , former smoker,    Pulmonary exam normal        Cardiovascular hypertension, Pt. on medications Normal cardiovascular exam     Neuro/Psych PSYCHIATRIC DISORDERS Anxiety Depression negative neurological ROS     GI/Hepatic Neg liver ROS, GERD  Medicated and Controlled,  Endo/Other  Hypothyroidism Morbid obesity  Renal/GU negative Renal ROS     Musculoskeletal negative musculoskeletal ROS (+)   Abdominal (+) + obese,   Peds  Hematology negative hematology ROS (+)   Anesthesia Other Findings Covid neg 02/22/20   Reproductive/Obstetrics                            Anesthesia Physical Anesthesia Plan  ASA: III  Anesthesia Plan: General   Post-op Pain Management:    Induction: Intravenous  PONV Risk Score and Plan: 4 or greater and Treatment may vary due to age or medical condition, Ondansetron, Midazolam and Dexamethasone  Airway Management Planned: Oral ETT  Additional Equipment: None  Intra-op Plan:   Post-operative Plan: Extubation in OR  Informed Consent: I have reviewed the patients History and Physical, chart, labs and discussed the procedure including the risks, benefits and alternatives for the proposed anesthesia with the patient or authorized representative who has indicated his/her understanding and acceptance.     Dental advisory given  Plan Discussed with: CRNA and Anesthesiologist  Anesthesia Plan Comments:         Anesthesia Quick Evaluation

## 2020-02-26 ENCOUNTER — Inpatient Hospital Stay (HOSPITAL_COMMUNITY)
Admission: RE | Admit: 2020-02-26 | Discharge: 2020-02-27 | DRG: 621 | Disposition: A | Discharge status: Home / Self Care | Payer: Commercial Managed Care - PPO | Attending: General Surgery | Admitting: General Surgery

## 2020-02-26 ENCOUNTER — Encounter (HOSPITAL_COMMUNITY): Payer: Self-pay | Admitting: General Surgery

## 2020-02-26 ENCOUNTER — Inpatient Hospital Stay (HOSPITAL_COMMUNITY): Payer: Commercial Managed Care - PPO | Admitting: Physician Assistant

## 2020-02-26 ENCOUNTER — Encounter (HOSPITAL_COMMUNITY)
Admission: RE | Disposition: A | Discharge status: Home / Self Care | Payer: Self-pay | Source: Home / Self Care | Attending: General Surgery

## 2020-02-26 ENCOUNTER — Other Ambulatory Visit: Payer: Self-pay

## 2020-02-26 ENCOUNTER — Inpatient Hospital Stay (HOSPITAL_COMMUNITY): Payer: Commercial Managed Care - PPO | Admitting: Registered Nurse

## 2020-02-26 DIAGNOSIS — K219 Gastro-esophageal reflux disease without esophagitis: Secondary | ICD-10-CM | POA: Diagnosis present

## 2020-02-26 DIAGNOSIS — Z7982 Long term (current) use of aspirin: Secondary | ICD-10-CM | POA: Diagnosis not present

## 2020-02-26 DIAGNOSIS — E039 Hypothyroidism, unspecified: Secondary | ICD-10-CM | POA: Diagnosis present

## 2020-02-26 DIAGNOSIS — Z79899 Other long term (current) drug therapy: Secondary | ICD-10-CM

## 2020-02-26 DIAGNOSIS — F329 Major depressive disorder, single episode, unspecified: Secondary | ICD-10-CM | POA: Diagnosis present

## 2020-02-26 DIAGNOSIS — Z79890 Hormone replacement therapy (postmenopausal): Secondary | ICD-10-CM

## 2020-02-26 DIAGNOSIS — Z818 Family history of other mental and behavioral disorders: Secondary | ICD-10-CM | POA: Diagnosis not present

## 2020-02-26 DIAGNOSIS — F419 Anxiety disorder, unspecified: Secondary | ICD-10-CM | POA: Diagnosis present

## 2020-02-26 DIAGNOSIS — M810 Age-related osteoporosis without current pathological fracture: Secondary | ICD-10-CM | POA: Diagnosis present

## 2020-02-26 DIAGNOSIS — Z87442 Personal history of urinary calculi: Secondary | ICD-10-CM

## 2020-02-26 DIAGNOSIS — I10 Essential (primary) hypertension: Secondary | ICD-10-CM | POA: Diagnosis present

## 2020-02-26 DIAGNOSIS — Z6841 Body Mass Index (BMI) 40.0 and over, adult: Secondary | ICD-10-CM

## 2020-02-26 DIAGNOSIS — Z87891 Personal history of nicotine dependence: Secondary | ICD-10-CM | POA: Diagnosis not present

## 2020-02-26 DIAGNOSIS — Z8249 Family history of ischemic heart disease and other diseases of the circulatory system: Secondary | ICD-10-CM

## 2020-02-26 DIAGNOSIS — G4733 Obstructive sleep apnea (adult) (pediatric): Secondary | ICD-10-CM | POA: Diagnosis present

## 2020-02-26 HISTORY — PX: GASTRIC ROUX-EN-Y: SHX5262

## 2020-02-26 LAB — PREGNANCY, URINE: Preg Test, Ur: NEGATIVE

## 2020-02-26 LAB — HEMOGLOBIN AND HEMATOCRIT, BLOOD
HCT: 45.2 % (ref 36.0–46.0)
Hemoglobin: 14.4 g/dL (ref 12.0–15.0)

## 2020-02-26 LAB — TYPE AND SCREEN
ABO/RH(D): B POS
Antibody Screen: NEGATIVE

## 2020-02-26 SURGERY — LAPAROSCOPIC ROUX-EN-Y GASTRIC BYPASS WITH UPPER ENDOSCOPY
Anesthesia: General | Site: Abdomen

## 2020-02-26 MED ORDER — MORPHINE SULFATE (PF) 2 MG/ML IV SOLN
1.0000 mg | INTRAVENOUS | Status: DC | PRN
  Administered 2020-02-26: 2 mg via INTRAVENOUS
  Filled 2020-02-26: qty 1

## 2020-02-26 MED ORDER — SODIUM CHLORIDE 0.9 % IV SOLN
2.0000 g | INTRAVENOUS | Status: AC
  Administered 2020-02-26: 2 g via INTRAVENOUS

## 2020-02-26 MED ORDER — ROCURONIUM BROMIDE 100 MG/10ML IV SOLN
INTRAVENOUS | Status: DC | PRN
  Administered 2020-02-26: 30 mg via INTRAVENOUS
  Administered 2020-02-26: 40 mg via INTRAVENOUS
  Administered 2020-02-26: 30 mg via INTRAVENOUS

## 2020-02-26 MED ORDER — SUGAMMADEX SODIUM 500 MG/5ML IV SOLN
INTRAVENOUS | Status: AC
  Filled 2020-02-26: qty 5

## 2020-02-26 MED ORDER — LIDOCAINE 2% (20 MG/ML) 5 ML SYRINGE
INTRAMUSCULAR | Status: AC
  Filled 2020-02-26: qty 5

## 2020-02-26 MED ORDER — DEXAMETHASONE SODIUM PHOSPHATE 10 MG/ML IJ SOLN
INTRAMUSCULAR | Status: AC
  Filled 2020-02-26: qty 1

## 2020-02-26 MED ORDER — ACETAMINOPHEN 160 MG/5ML PO SOLN
1000.0000 mg | Freq: Three times a day (TID) | ORAL | Status: DC
  Administered 2020-02-26 – 2020-02-27 (×2): 1000 mg via ORAL
  Filled 2020-02-26 (×2): qty 40.6

## 2020-02-26 MED ORDER — LACTATED RINGERS IR SOLN
Status: DC | PRN
  Administered 2020-02-26: 1000 mL

## 2020-02-26 MED ORDER — STERILE WATER FOR IRRIGATION IR SOLN
Status: DC | PRN
  Administered 2020-02-26: 1000 mL

## 2020-02-26 MED ORDER — SODIUM CHLORIDE 0.9 % IV SOLN
INTRAVENOUS | Status: AC
  Filled 2020-02-26: qty 2

## 2020-02-26 MED ORDER — BUPIVACAINE HCL 0.25 % IJ SOLN
INTRAMUSCULAR | Status: AC
  Filled 2020-02-26: qty 1

## 2020-02-26 MED ORDER — OXYCODONE HCL 5 MG/5ML PO SOLN: 5.0000 mg | Freq: Once | ORAL | Status: DC | PRN

## 2020-02-26 MED ORDER — PHENYLEPHRINE HCL (PRESSORS) 10 MG/ML IV SOLN
INTRAVENOUS | Status: AC
  Filled 2020-02-26: qty 1

## 2020-02-26 MED ORDER — LIDOCAINE HCL (CARDIAC) PF 100 MG/5ML IV SOSY
PREFILLED_SYRINGE | INTRAVENOUS | Status: DC | PRN
  Administered 2020-02-26: 80 mg via INTRAVENOUS

## 2020-02-26 MED ORDER — ONDANSETRON HCL 4 MG/2ML IJ SOLN: 4.0000 mg | INTRAMUSCULAR | Status: DC | PRN

## 2020-02-26 MED ORDER — EPHEDRINE SULFATE-NACL 50-0.9 MG/10ML-% IV SOSY
PREFILLED_SYRINGE | INTRAVENOUS | Status: DC | PRN
  Administered 2020-02-26: 10 mg via INTRAVENOUS
  Administered 2020-02-26: 5 mg via INTRAVENOUS

## 2020-02-26 MED ORDER — LIDOCAINE 20MG/ML (2%) 15 ML SYRINGE OPTIME
INTRAMUSCULAR | Status: DC | PRN
  Administered 2020-02-26: 1.5 mg/kg/h via INTRAVENOUS

## 2020-02-26 MED ORDER — PROMETHAZINE HCL 25 MG/ML IJ SOLN: 6.2500 mg | INTRAMUSCULAR | Status: DC | PRN

## 2020-02-26 MED ORDER — FAMOTIDINE IN NACL 20-0.9 MG/50ML-% IV SOLN
20.0000 mg | Freq: Two times a day (BID) | INTRAVENOUS | Status: DC
  Administered 2020-02-26 – 2020-02-27 (×3): 20 mg via INTRAVENOUS
  Filled 2020-02-26 (×3): qty 50

## 2020-02-26 MED ORDER — ACETAMINOPHEN 500 MG PO TABS: 1000.0000 mg | ORAL_TABLET | ORAL | Status: AC

## 2020-02-26 MED ORDER — FENTANYL CITRATE (PF) 100 MCG/2ML IJ SOLN
INTRAMUSCULAR | Status: DC | PRN
  Administered 2020-02-26: 100 ug via INTRAVENOUS
  Administered 2020-02-26: 50 ug via INTRAVENOUS

## 2020-02-26 MED ORDER — SUGAMMADEX SODIUM 500 MG/5ML IV SOLN
INTRAVENOUS | Status: DC | PRN
  Administered 2020-02-26: 500 mg via INTRAVENOUS

## 2020-02-26 MED ORDER — LIDOCAINE HCL 2 % IJ SOLN
INTRAMUSCULAR | Status: AC
  Filled 2020-02-26: qty 20

## 2020-02-26 MED ORDER — LACTATED RINGERS IV SOLN: INTRAVENOUS | Status: DC

## 2020-02-26 MED ORDER — ONDANSETRON HCL 4 MG/2ML IJ SOLN
INTRAMUSCULAR | Status: DC | PRN
  Administered 2020-02-26: 4 mg via INTRAVENOUS

## 2020-02-26 MED ORDER — HEPARIN SODIUM (PORCINE) 5000 UNIT/ML IJ SOLN: 5000.0000 [IU] | INTRAMUSCULAR | Status: AC

## 2020-02-26 MED ORDER — DEXTROSE-NACL 5-0.45 % IV SOLN: INTRAVENOUS | Status: DC

## 2020-02-26 MED ORDER — HEPARIN SODIUM (PORCINE) 5000 UNIT/ML IJ SOLN
INTRAMUSCULAR | Status: AC
  Administered 2020-02-26: 5000 [IU] via SUBCUTANEOUS
  Filled 2020-02-26: qty 1

## 2020-02-26 MED ORDER — APREPITANT 40 MG PO CAPS
ORAL_CAPSULE | ORAL | Status: AC
  Administered 2020-02-26: 40 mg via ORAL
  Filled 2020-02-26: qty 1

## 2020-02-26 MED ORDER — ENSURE MAX PROTEIN PO LIQD
2.0000 [oz_av] | ORAL | Status: DC
  Administered 2020-02-27: 2 [oz_av] via ORAL

## 2020-02-26 MED ORDER — SIMETHICONE 80 MG PO CHEW: 80.0000 mg | CHEWABLE_TABLET | Freq: Four times a day (QID) | ORAL | Status: DC | PRN

## 2020-02-26 MED ORDER — PHENYLEPHRINE HCL-NACL 10-0.9 MG/250ML-% IV SOLN
INTRAVENOUS | Status: DC | PRN
  Administered 2020-02-26: 25 ug/min via INTRAVENOUS

## 2020-02-26 MED ORDER — MIDAZOLAM HCL 5 MG/5ML IJ SOLN
INTRAMUSCULAR | Status: DC | PRN
  Administered 2020-02-26: 2 mg via INTRAVENOUS

## 2020-02-26 MED ORDER — SCOPOLAMINE 1 MG/3DAYS TD PT72: 1.0000 | MEDICATED_PATCH | TRANSDERMAL | Status: DC

## 2020-02-26 MED ORDER — SUCCINYLCHOLINE CHLORIDE 20 MG/ML IJ SOLN
INTRAMUSCULAR | Status: DC | PRN
  Administered 2020-02-26: 120 mg via INTRAVENOUS

## 2020-02-26 MED ORDER — SCOPOLAMINE 1 MG/3DAYS TD PT72
MEDICATED_PATCH | TRANSDERMAL | Status: AC
  Administered 2020-02-26: 1.5 mg via TRANSDERMAL
  Filled 2020-02-26: qty 1

## 2020-02-26 MED ORDER — MIDAZOLAM HCL 2 MG/2ML IJ SOLN
INTRAMUSCULAR | Status: AC
  Filled 2020-02-26: qty 2

## 2020-02-26 MED ORDER — BUPIVACAINE HCL 0.25 % IJ SOLN
INTRAMUSCULAR | Status: DC | PRN
  Administered 2020-02-26: 30 mL

## 2020-02-26 MED ORDER — FENTANYL CITRATE (PF) 100 MCG/2ML IJ SOLN
25.0000 ug | INTRAMUSCULAR | Status: DC | PRN
  Administered 2020-02-26: 50 ug via INTRAVENOUS

## 2020-02-26 MED ORDER — PROMETHAZINE HCL 25 MG/ML IJ SOLN
INTRAMUSCULAR | Status: AC
  Filled 2020-02-26: qty 1

## 2020-02-26 MED ORDER — EPHEDRINE 5 MG/ML INJ
INTRAVENOUS | Status: AC
  Filled 2020-02-26: qty 10

## 2020-02-26 MED ORDER — FENTANYL CITRATE (PF) 250 MCG/5ML IJ SOLN
INTRAMUSCULAR | Status: AC
  Filled 2020-02-26: qty 5

## 2020-02-26 MED ORDER — GABAPENTIN 300 MG PO CAPS: 300.0000 mg | ORAL_CAPSULE | ORAL | Status: AC

## 2020-02-26 MED ORDER — PROPOFOL 10 MG/ML IV BOLUS
INTRAVENOUS | Status: DC | PRN
  Administered 2020-02-26: 200 mg via INTRAVENOUS

## 2020-02-26 MED ORDER — GABAPENTIN 300 MG PO CAPS
ORAL_CAPSULE | ORAL | Status: AC
  Administered 2020-02-26: 300 mg via ORAL
  Filled 2020-02-26: qty 1

## 2020-02-26 MED ORDER — DEXAMETHASONE SODIUM PHOSPHATE 4 MG/ML IJ SOLN
4.0000 mg | INTRAMUSCULAR | Status: AC
  Administered 2020-02-26 (×2): 5 mg via INTRAVENOUS

## 2020-02-26 MED ORDER — ROCURONIUM BROMIDE 10 MG/ML (PF) SYRINGE
PREFILLED_SYRINGE | INTRAVENOUS | Status: AC
  Filled 2020-02-26: qty 10

## 2020-02-26 MED ORDER — BUPIVACAINE LIPOSOME 1.3 % IJ SUSP
INTRAMUSCULAR | Status: DC | PRN
  Administered 2020-02-26: 20 mL

## 2020-02-26 MED ORDER — PROPOFOL 10 MG/ML IV BOLUS
INTRAVENOUS | Status: AC
  Filled 2020-02-26: qty 40

## 2020-02-26 MED ORDER — ENOXAPARIN SODIUM 30 MG/0.3ML ~~LOC~~ SOLN
30.0000 mg | Freq: Two times a day (BID) | SUBCUTANEOUS | Status: DC
  Administered 2020-02-26 – 2020-02-27 (×2): 30 mg via SUBCUTANEOUS
  Filled 2020-02-26 (×2): qty 0.3

## 2020-02-26 MED ORDER — OXYCODONE HCL 5 MG/5ML PO SOLN
5.0000 mg | Freq: Four times a day (QID) | ORAL | Status: DC | PRN
  Administered 2020-02-26 (×2): 5 mg via ORAL
  Filled 2020-02-26 (×2): qty 5

## 2020-02-26 MED ORDER — ACETAMINOPHEN 500 MG PO TABS: 1000.0000 mg | ORAL_TABLET | Freq: Three times a day (TID) | ORAL | Status: DC

## 2020-02-26 MED ORDER — APREPITANT 40 MG PO CAPS: 40.0000 mg | ORAL_CAPSULE | ORAL | Status: AC

## 2020-02-26 MED ORDER — KETAMINE HCL 10 MG/ML IJ SOLN
INTRAMUSCULAR | Status: AC
  Filled 2020-02-26: qty 1

## 2020-02-26 MED ORDER — ONDANSETRON HCL 4 MG/2ML IJ SOLN
INTRAMUSCULAR | Status: AC
  Filled 2020-02-26: qty 2

## 2020-02-26 MED ORDER — FENTANYL CITRATE (PF) 100 MCG/2ML IJ SOLN
INTRAMUSCULAR | Status: AC
  Filled 2020-02-26: qty 2

## 2020-02-26 MED ORDER — SUCCINYLCHOLINE CHLORIDE 200 MG/10ML IV SOSY
PREFILLED_SYRINGE | INTRAVENOUS | Status: AC
  Filled 2020-02-26: qty 10

## 2020-02-26 MED ORDER — CHLORHEXIDINE GLUCONATE CLOTH 2 % EX PADS: 6.0000 | MEDICATED_PAD | Freq: Once | CUTANEOUS | Status: DC

## 2020-02-26 MED ORDER — LEVOTHYROXINE SODIUM 25 MCG PO TABS
25.0000 ug | ORAL_TABLET | Freq: Every day | ORAL | Status: DC
  Administered 2020-02-27: 25 ug via ORAL
  Filled 2020-02-26: qty 1

## 2020-02-26 MED ORDER — ACETAMINOPHEN 500 MG PO TABS
ORAL_TABLET | ORAL | Status: AC
  Administered 2020-02-26: 1000 mg via ORAL
  Filled 2020-02-26: qty 2

## 2020-02-26 MED ORDER — KETAMINE HCL 10 MG/ML IJ SOLN
INTRAMUSCULAR | Status: DC | PRN
Start: 2020-02-26 — End: 2020-02-26
  Administered 2020-02-26: 30 mg via INTRAVENOUS

## 2020-02-26 MED ORDER — OXYCODONE HCL 5 MG PO TABS: 5.0000 mg | ORAL_TABLET | Freq: Once | ORAL | Status: DC | PRN

## 2020-02-26 SURGICAL SUPPLY — 68 items
APPLIER CLIP 5 13 M/L LIGAMAX5 (MISCELLANEOUS)
APPLIER CLIP ROT 10 11.4 M/L (STAPLE)
APPLIER CLIP ROT 13.4 12 LRG (CLIP)
BENZOIN TINCTURE PRP APPL 2/3 (GAUZE/BANDAGES/DRESSINGS) ×1 IMPLANT
BLADE SURG SZ11 CARB STEEL (BLADE) ×2 IMPLANT
BNDG ADH 1X3 SHEER STRL LF (GAUZE/BANDAGES/DRESSINGS) ×6 IMPLANT
CABLE HIGH FREQUENCY MONO STRZ (ELECTRODE) ×1 IMPLANT
CHLORAPREP W/TINT 26 (MISCELLANEOUS) ×3 IMPLANT
CLIP APPLIE 5 13 M/L LIGAMAX5 (MISCELLANEOUS) IMPLANT
CLIP APPLIE ROT 10 11.4 M/L (STAPLE) IMPLANT
CLIP APPLIE ROT 13.4 12 LRG (CLIP) IMPLANT
CLIP SUT LAPRA TY ABSORB (SUTURE) ×1 IMPLANT
COVER SURGICAL LIGHT HANDLE (MISCELLANEOUS) ×2 IMPLANT
COVER WAND RF STERILE (DRAPES) ×2 IMPLANT
DEVICE SUTURE ENDOST 10MM (ENDOMECHANICALS) ×2 IMPLANT
DRAIN CHANNEL 19F RND (DRAIN) IMPLANT
DRAIN PENROSE 0.25X18 (DRAIN) ×2 IMPLANT
ELECT L-HOOK LAP 45CM DISP (ELECTROSURGICAL) ×2
ELECTRODE L-HOOK LAP 45CM DISP (ELECTROSURGICAL) ×1 IMPLANT
EVACUATOR SILICONE 100CC (DRAIN) IMPLANT
GAUZE 4X4 16PLY RFD (DISPOSABLE) ×2 IMPLANT
GLOVE BIOGEL PI IND STRL 7.0 (GLOVE) ×1 IMPLANT
GLOVE BIOGEL PI INDICATOR 7.0 (GLOVE) ×3
GLOVE SURG SS PI 7.0 STRL IVOR (GLOVE) ×3 IMPLANT
GOWN STRL REUS W/TWL LRG LVL3 (GOWN DISPOSABLE) ×3 IMPLANT
GOWN STRL REUS W/TWL XL LVL3 (GOWN DISPOSABLE) ×5 IMPLANT
GRASPER SUT TROCAR 14GX15 (MISCELLANEOUS) IMPLANT
HANDLE STAPLE EGIA 4 XL (STAPLE) ×2 IMPLANT
HOVERMATT SINGLE USE (MISCELLANEOUS) ×2 IMPLANT
KIT BASIN (CUSTOM PROCEDURE TRAY) ×2 IMPLANT
KIT GASTRIC LAVAGE 34FR ADT (SET/KITS/TRAYS/PACK) IMPLANT
KIT TURNOVER KIT A (KITS) IMPLANT
MARKER SKIN DUAL TIP RULER LAB (MISCELLANEOUS) ×2 IMPLANT
NDL SPNL 22GX3.5 QUINCKE BK (NEEDLE) ×1 IMPLANT
NEEDLE SPNL 22GX3.5 QUINCKE BK (NEEDLE) ×2 IMPLANT
PACK CARDIOVASCULAR III (CUSTOM PROCEDURE TRAY) ×2 IMPLANT
PENCIL SMOKE EVACUATOR (MISCELLANEOUS) IMPLANT
RELOAD EGIA 45 MED/THCK PURPLE (STAPLE) ×2 IMPLANT
RELOAD EGIA 45 TAN VASC (STAPLE) IMPLANT
RELOAD EGIA 60 MED/THCK PURPLE (STAPLE) ×8 IMPLANT
RELOAD EGIA 60 TAN VASC (STAPLE) ×6 IMPLANT
RELOAD ENDO STITCH 2.0 (ENDOMECHANICALS) ×12
RELOAD STAPLE 60 MED/THCK ART (STAPLE) ×4 IMPLANT
RELOAD SUT SNGL STCH ABSRB 2-0 (ENDOMECHANICALS) ×5 IMPLANT
RELOAD SUT SNGL STCH BLK 2-0 (ENDOMECHANICALS) ×6 IMPLANT
SCISSORS LAP 5X45 EPIX DISP (ENDOMECHANICALS) ×2 IMPLANT
SET IRRIG TUBING LAPAROSCOPIC (IRRIGATION / IRRIGATOR) ×2 IMPLANT
SET TUBE SMOKE EVAC HIGH FLOW (TUBING) ×2 IMPLANT
SHEARS HARMONIC ACE PLUS 45CM (MISCELLANEOUS) ×2 IMPLANT
SLEEVE XCEL OPT CAN 5 100 (ENDOMECHANICALS) ×6 IMPLANT
SOL ANTI FOG 6CC (MISCELLANEOUS) ×1 IMPLANT
SOLUTION ANTI FOG 6CC (MISCELLANEOUS) ×1
STAPLER VISISTAT 35W (STAPLE) IMPLANT
STRIP CLOSURE SKIN 1/2X4 (GAUZE/BANDAGES/DRESSINGS) ×1 IMPLANT
SUT ETHILON 2 0 PS N (SUTURE) IMPLANT
SUT MNCRL AB 4-0 PS2 18 (SUTURE) ×3 IMPLANT
SUT RELOAD ENDO STITCH 2 48X1 (ENDOMECHANICALS) ×5
SUT RELOAD ENDO STITCH 2.0 (ENDOMECHANICALS) ×7
SUT SILK 0 SH 30 (SUTURE) IMPLANT
SUT VICRYL 0 TIES 12 18 (SUTURE) IMPLANT
SUTURE RELOAD END STTCH 2 48X1 (ENDOMECHANICALS) ×5 IMPLANT
SUTURE RELOAD ENDO STITCH 2.0 (ENDOMECHANICALS) ×7 IMPLANT
SYR 20ML LL LF (SYRINGE) ×2 IMPLANT
TOWEL OR 17X26 10 PK STRL BLUE (TOWEL DISPOSABLE) ×2 IMPLANT
TOWEL OR NON WOVEN STRL DISP B (DISPOSABLE) ×2 IMPLANT
TROCAR BLADELESS OPT 5 100 (ENDOMECHANICALS) ×2 IMPLANT
TROCAR XCEL 12X100 BLDLESS (ENDOMECHANICALS) ×2 IMPLANT
TUBING CONNECTING 10 (TUBING) ×2 IMPLANT

## 2020-02-26 NOTE — H&P (Signed)
Heidi Pace is an 54 y.o. female.   Chief Complaint: obesity HPI: 54 yo female with obesity and multiple medical problems. She has completed all preoperative requirements and presents for bariatric surgery.  Past Medical History:  Diagnosis Date  . Acid reflux   . Ankle swelling   . Anxiety   . Depression   . History of kidney stones   . Hypertension   . Hypothyroidism   . Obesity   . OSA on CPAP   . Osteoporosis    Left knee    Past Surgical History:  Procedure Laterality Date  . CESAREAN SECTION    . CHOLECYSTECTOMY    . DIAGNOSTIC LAPAROSCOPY    . DILATION AND CURETTAGE OF UTERUS    . SALIVARY STONE REMOVAL    . TUBAL LIGATION      Family History  Problem Relation Age of Onset  . Depression Mother   . Diabetes Mother   . Thyroid disease Mother   . Hypertension Father   . Melanoma Father   . Depression Daughter    Social History:  reports that she quit smoking about 21 years ago. Her smoking use included cigarettes. She has never used smokeless tobacco. She reports current alcohol use. She reports that she does not use drugs.  Allergies: No Known Allergies  Medications Prior to Admission  Medication Sig Dispense Refill  . Ascorbic Acid (VITAMIN C PO) Take 1 tablet by mouth daily.     . ASPIRIN 81 PO Take 81 mg by mouth daily.     Marland Kitchen BIOTIN PO Take 1 tablet by mouth daily.     Marland Kitchen buPROPion (WELLBUTRIN XL) 150 MG 24 hr tablet Take 150 mg by mouth every morning.     . Calcium Carb-Cholecalciferol (CALCIUM/VITAMIN D PO) Take 1 tablet by mouth daily.     Marland Kitchen FLUoxetine (PROZAC) 20 MG capsule Take 20 mg by mouth every morning.     Marland Kitchen levothyroxine (SYNTHROID) 25 MCG tablet Take 25 mcg by mouth daily before breakfast.     . lisinopril-hydrochlorothiazide (ZESTORETIC) 10-12.5 MG tablet Take 1 tablet by mouth daily.    . Multiple Vitamin (MULTIVITAMIN) tablet Take 1 tablet by mouth daily.    Marland Kitchen omeprazole (PRILOSEC) 20 MG capsule Take 20 mg by mouth daily.      Results  for orders placed or performed during the hospital encounter of 02/26/20 (from the past 48 hour(s))  Pregnancy, urine STAT morning of surgery     Status: None   Collection Time: 02/26/20  5:28 AM  Result Value Ref Range   Preg Test, Ur NEGATIVE NEGATIVE    Comment:        THE SENSITIVITY OF THIS METHODOLOGY IS >20 mIU/mL. Performed at [facility], [address], [place], [place] [address]    No results found.  Review of Systems  Constitutional: Negative for chills and fever.  HENT: Negative for hearing loss.   Respiratory: Negative for cough.   Cardiovascular: Negative for chest pain and palpitations.  Gastrointestinal: Negative for abdominal pain, nausea and vomiting.  Genitourinary: Negative for dysuria and urgency.  Musculoskeletal: Negative for myalgias and neck pain.  Skin: Negative for rash.  Neurological: Negative for dizziness and headaches.  Hematological: Does not bruise/bleed easily.  Psychiatric/Behavioral: Negative for suicidal ideas.    Blood pressure (!) 163/98, pulse 75, temperature 97.9 F (36.6 C), temperature source Oral, resp. rate 18, weight (!) 137.2 kg, SpO2 95 %. Physical Exam  Vitals reviewed. Constitutional: She  is oriented to person, place, and time. She appears well-developed and well-nourished.  HENT:  Head: Normocephalic and atraumatic.  Eyes: Pupils are equal, round, and reactive to light. Conjunctivae and EOM are normal.  Cardiovascular: Normal rate and regular rhythm.  Respiratory: Effort normal and breath sounds normal.  GI: Soft. Bowel sounds are normal. She exhibits no distension. There is no abdominal tenderness.  Musculoskeletal:        General: Normal range of motion.     Cervical back: Normal range of motion and neck supple.  Neurological: She is alert and oriented to person, place, and time.  Skin: Skin is warm and dry.  Psychiatric: She has a normal mood and affect. Her behavior is normal.     Assessment/Plan 54 yo female with class III obesity with reflux -lap RNY gastric bypass -ERAS protocol -inpatient admission  Mickeal Skinner, MD 02/26/2020, 7:15 AM

## 2020-02-26 NOTE — Anesthesia Postprocedure Evaluation (Signed)
Anesthesia Post Note  Patient: Heidi Pace  Procedure(s) Performed: LAPAROSCOPIC ROUX-EN-Y GASTRIC BYPASS WITH UPPER ENDOSCOPY, ERAS Pathway (N/A Abdomen)     Patient location during evaluation: PACU Anesthesia Type: General Level of consciousness: awake and alert Pain management: pain level controlled Vital Signs Assessment: post-procedure vital signs reviewed and stable Respiratory status: spontaneous breathing, nonlabored ventilation and respiratory function stable Cardiovascular status: blood pressure returned to baseline and stable Postop Assessment: no apparent nausea or vomiting Anesthetic complications: no    Last Vitals:  Vitals:   02/26/20 1130 02/26/20 1148  BP: (!) 149/85 (!) 171/88  Pulse: (!) 57 72  Resp: 17 16  Temp: 36.4 C 36.4 C  SpO2: 96% 95%    Last Pain:  Vitals:   02/26/20 1218  TempSrc:   PainSc: 3                  Beryle Lathe

## 2020-02-26 NOTE — Op Note (Signed)
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 Daphine Deutscher  M.D., FACS   Anesthesia: Gen.   Indications for procedure: This patient was undergoing a roux Y gastric bypass by Dr. Sheliah Hatch.  When the GJ was completed an endoscopy was performed to check for bleeding and leaks.    Description of procedure: The endoscopy was placed in the mouth and into the oropharynx and under endoscopic vision it was advanced to the esophagogastric junction.  The pouch was insufflated and measured to be ~ 4-5 cm in length.  The anastomosis was noted and was patent. .   No bleeding or leaks were detected.  The scope was withdrawn without difficulty.     Matt B. Daphine Deutscher, MD, FACS General, Bariatric, & Minimally Invasive Surgery [facility], [place]

## 2020-02-26 NOTE — Progress Notes (Signed)
Discussed post op day goals with patient including ambulation, IS, diet progression, pain, and nausea control.  BSTOP education provided including BSTOP information guide, "Guide for Pain Management after your Bariatric Procedure".  Questions answered. 

## 2020-02-26 NOTE — Progress Notes (Signed)
Patient has ambulated, voided, is using her incentive spirometer, and her vitals are stable. Patient started drinking first 2oz cup of water at 1438.

## 2020-02-26 NOTE — Progress Notes (Signed)
PHARMACY CONSULT FOR:  Risk Assessment for Post-Discharge VTE Following Bariatric Surgery  Post-Discharge VTE Risk Assessment: This patient's probability of 30-day post-discharge VTE is increased due to the factors marked:   Female    Age >/=60 years   X BMI >/=50 kg/m2    CHF    Dyspnea at Rest    Paraplegia  X  Non-gastric-band surgery    Operation Time >/=3 hr    Return to OR     Length of Stay >/= 3 d      Hx of VTE   Hypercoagulable condition   Significant venous stasis   Predicted probability of 30-day post-discharge VTE: 0.27%  Other patient-specific factors to consider:  Recommendation for Discharge: No pharmacologic prophylaxis post-discharge  Heidi Pace is a 54 y.o. female who underwent Laparoscopic Roux-En-Y on 5/24   Case start: 0751 Case end: 0949   No Known Allergies  Patient Measurements: Weight: (!) 137.2 kg (302 lb 6.4 oz) Body mass index is 51.91 kg/m.  Recent Labs    02/26/20 1050  HGB 14.4  HCT 45.2   Estimated Creatinine Clearance: 103.5 mL/min (by C-G formula based on SCr of 0.87 mg/dL).  Past Medical History:  Diagnosis Date  . Acid reflux   . Ankle swelling   . Anxiety   . Depression   . History of kidney stones   . Hypertension   . Hypothyroidism   . Obesity   . OSA on CPAP   . Osteoporosis    Left knee   Medications Prior to Admission  Medication Sig Dispense Refill Last Dose  . Ascorbic Acid (VITAMIN C PO) Take 1 tablet by mouth daily.    02/25/2020 at Unknown time  . ASPIRIN 81 PO Take 81 mg by mouth daily.    Past Week at Unknown time  . BIOTIN PO Take 1 tablet by mouth daily.    02/25/2020 at Unknown time  . buPROPion (WELLBUTRIN XL) 150 MG 24 hr tablet Take 150 mg by mouth every morning.    02/26/2020 at 0400  . Calcium Carb-Cholecalciferol (CALCIUM/VITAMIN D PO) Take 1 tablet by mouth daily.    02/25/2020 at Unknown time  . FLUoxetine (PROZAC) 20 MG capsule Take 20 mg by mouth every morning.    02/26/2020 at 0400  .  levothyroxine (SYNTHROID) 25 MCG tablet Take 25 mcg by mouth daily before breakfast.    02/26/2020 at 0400  . lisinopril-hydrochlorothiazide (ZESTORETIC) 10-12.5 MG tablet Take 1 tablet by mouth daily.   Past Week at Unknown time  . Multiple Vitamin (MULTIVITAMIN) tablet Take 1 tablet by mouth daily.   02/25/2020 at Unknown time  . omeprazole (PRILOSEC) 20 MG capsule Take 20 mg by mouth daily.   02/26/2020 at 0400   Otho Bellows PharmD 02/26/2020,12:27 PM

## 2020-02-26 NOTE — Op Note (Signed)
**Note Heidi-Identified via Obfuscation** Preop Diagnosis: Obesity Class III  Postop Diagnosis: same  Procedure performed: laparoscopic Roux en Y gastric bypass  Assitant: Kaylyn Lim  Indications:  The patient is a 54 y.o. year-old morbidly obese female who has been followed in the Bariatric Clinic as an outpatient. This patient was diagnosed with morbid obesity with a BMI of Body mass index is 51.91 kg/m. and significant co-morbidities including hypertension and GERD.  The patient was counseled extensively in the Bariatric Outpatient Clinic and after a thorough explanation of the risks and benefits of surgery (including death from complications, bowel leak, infection such as peritonitis and/or sepsis, internal hernia, bleeding, need for blood transfusion, bowel obstruction, organ failure, pulmonary embolus, deep venous thrombosis, wound infection, incisional hernia, skin breakdown, and others entailed on the consent form) and after a compliant diet and exercise program, the patient was scheduled for an elective laparoscopic gastric bypass.  Description of Operation:  Following informed consent, the patient was taken to the operating room and placed on the operating table in the supine position.  She had previously received prophylactic antibiotics and subcutaneous heparin for DVT prophylaxis in the pre-op holding area.  After induction of general endotracheal anesthesia by the anesthesiologist, the patient underwent placement of sequential compression devices, Foley catheter and an oro-gastric tube.  A timeout was confirmed by the surgery and anesthesia teams.  The patient was adequately padded at all pressure points and placed on a footboard to prevent slippage from the OR table during extremes of position during surgery.  She underwent a routine sterile prep and drape of her entire abdomen.    Next, A transverse incision was made under the left subcostal area and a 23mm optical viewing trocar was introduced into the peritoneal cavity.  Pneumoperitoneum was applied with a high flow and low pressure. A laparoscope was inserted to confirm placement. A extraperitoneal block was then placed at the lateral abdominal wall using exparel diluted with marcaine . 5 additional trocars were placed: 1 67mm trocar to the left of the midline. 1 additional 24mm trocar in the left lateral area, 1 55mm trocar in the right mid abdomen, and 1 42mm trocar in the right subcostal area.  The greater omentum was flipped over the transverse colon and under the left lobe of the liver. The ligament of trietz was identified. 40cm of jejunum was measured starting from the ligament of Trietz. The mesentery was checked to ensure mobility. Next, a 6mm 2-80mm tristapler was used to divide the jejunum at this location. The harmonic scalpel was used to divide the mesentery down to the origin. A 1/2" penrose was sutured to the distal side. 100cm of jejunum was measured starting at the division. 2-0 silk was used to appose the biliary limb to the 100cm mark of jejunum in 2 places. Enterotomies were made in the biliary and common channels and a 7mm 2-3 tristapler was used to create the J-J anastomosis. A 2-0 silk was used to appose the enterotomy edges and a 83mm 2-3 tristapler was used to close the enterotomy. An anti-obstruction 2-0 silk suture was placed. The serosa of the biliary limb was slightly purple and one additional 2-0 silk was placed to imbricate the serosa to the roux limb further. Next, the mesenteric defect was closed with a 2-0 silk in running fashion.The J-J appeared patent and in neutral position.  Next, the omentum was divided using the Harmonic scalpel. The patient was placed in steep Reverse Trendelenberg position. A Nathanson retracted was placed through a subxiphoid incision  and used to retract the liver. The fat pad over the fundus was incised to free the fundus. Next, a position along the lesser curve 6cm from GE junction was identified. The pars flaccida was  entered and the fat over the lesser curve divided to enter the lesser sac. Multiple 59mm 3-86mm tristaple firings were peformed to create a 6cm pouch. The Roux limb was identified using the placed penrose and brought up to the stomach in antecolic fashion. The limb was inspected to ensure a neutral position. A 2-0 vicryl suture was then used to create a posterior layer connecting the stomach to the Roux limb jejunum in running fashion. Next cautery was used to create an enterotomy along the medial aspect of this suture line and Harmonic scalpel used to create gastotomy. A 61mm 3-39mm tristapler was then used to create a 25-8mm anastomosis. 2 2-0 vicryl sutures were used in running fashion to close the gastrotomy. Finally, a 2-0 vicryl suture was used to close an anterior layer of stomach and jejunum over the anastomosis in running fashion. The penrose was removed from the Roux limb. A 2-0 silk was used to appose the transverse mesocolon to the mesentery of the Roux limb.  The assistant then went and performed an upper endoscopy and leak test. No bubbles were seen and the pouch and limb distended appropriately. The limb and pouch were deflated, the endoscope was removed. Hemostasis was ensured. Pneumoperitoneum was evacuated, all ports were removed and all incisions closed with 4-0 monocryl suture in subcuticular fashion. Steristrips and bandaids were put in place for dressing. The patient awoke from anesthesia and was brought to pacu in stable condition. All counts were correct.  Specimens:  None  Estimated Blood Loss: 20 ml  Local Anesthesia: 50 ml Exparel: 0.5% Marcaine Mix  Post-Op Plan:       Pain Management: PO, prn      Antibiotics: Prophylactic      Anticoagulation: Prophylactic, Starting now      Post Op Studies/Consults: Not applicable      Intended Discharge: within 48h      Intended Outpatient Follow-Up: Two Week      Intended Outpatient Studies: Not Applicable      Other: Not  Applicable   Heidi Pace

## 2020-02-26 NOTE — Discharge Instructions (Signed)
° ° ° °GASTRIC BYPASS/SLEEVE ° Home Care Instructions ° ° These instructions are to help you care for yourself when you go home. ° °Call: If you have any problems. °• Call [phone] and ask for the surgeon on call °• If you need immediate help, come to the ER at [facility].  °• Tell the ER staff that you are a new post-op gastric bypass or gastric sleeve patient °  °Signs and symptoms to report: • Severe vomiting or nausea °o If you cannot keep down clear liquids for longer than 1 day, call your surgeon  °• Abdominal pain that does not get better after taking your pain medication °• Fever over 100.4° F with chills °• Heart beating over 100 beats a minute °• Shortness of breath at rest °• Chest pain °•  Redness, swelling, drainage, or foul odor at incision (surgical) sites °•  If your incisions open or pull apart °• Swelling or pain in calf (lower leg) °• Diarrhea (Loose bowel movements that happen often), frequent watery, uncontrolled bowel movements °• Constipation, (no bowel movements for 3 days) if this happens: Pick one °o Milk of Magnesia, 2 tablespoons by mouth, 3 times a day for 2 days if needed °o Stop taking Milk of Magnesia once you have a bowel movement °o Call your doctor if constipation continues °Or °o Miralax  (instead of Milk of Magnesia) following the label instructions °o Stop taking Miralax once you have a bowel movement °o Call your doctor if constipation continues °• Anything you think is not normal °  °Normal side effects after surgery: • Unable to sleep at night or unable to focus °• Irritability or moody °• Being tearful (crying) or depressed °These are common complaints, possibly related to your anesthesia medications that put you to sleep, stress of surgery, and change in lifestyle.  This usually goes away a few weeks after surgery.  If these feelings continue, call your primary care doctor. °  °Wound Care: You may have surgical glue, steri-strips, or staples over your incisions after  surgery °• Surgical glue:  Looks like a clear film over your incisions and will wear off a little at a time °• Steri-strips: Strips of tape over your incisions. You may notice a yellowish color on the skin under the steri-strips. This is used to make the   steri-strips stick better. Do not pull the steri-strips off - let them fall off °• Staples: Staples may be removed before you leave the hospital °o If you go home with staples, call [facility], [phone] at for an appointment with your surgeon’s nurse to have staples removed 10 days after surgery. °• Showering: You may shower two (2) days after your surgery unless your surgeon tells you differently °o Wash gently around incisions with warm soapy water, rinse well, and gently pat dry  °o No tub baths until staples are removed, steri-strips fall off or glue is gone.  °  °Medications: • Medications should be liquid or crushed if larger than the size of a dime °• Extended release pills (medication that release a little bit at a time through the day) should NOT be crushed or cut. (examples include XL, ER, DR, SR) °• Depending on the size and number of medications you take, you may need to space (take a few throughout the day)/change the time you take your medications so that you do not over-fill your pouch (smaller stomach) °• Make sure you follow-up with your primary care doctor to   make medication changes needed during rapid weight loss and life-style changes °• If you have diabetes, follow up with the doctor that orders your diabetes medication(s) within one week after surgery and check your blood sugar regularly. °• Do not drive while taking prescription pain medication  °• It is ok to take Tylenol by the bottle instructions with your pain medicine or instead of your pain medicine as needed.  DO NOT TAKE NSAIDS (EXAMPLES OF NSAIDS:  IBUPROFREN/ NAPROXEN)  °Diet:                    First 2 Weeks ° You will see the dietician t about two (2) weeks  after your surgery. The dietician will increase the types of foods you can eat if you are handling liquids well: °• If you have severe vomiting or nausea and cannot keep down clear liquids lasting longer than 1 day, call your surgeon @ ([phone]) °Protein Shake °• Drink at least 2 ounces of shake 5-6 times per day °• Each serving of protein shakes (usually 8 - 12 ounces) should have: °o 15 grams of protein  °o And no more than 5 grams of carbohydrate  °• Goal for protein each day: °o Men = 80 grams per day °o Women = 60 grams per day °• Protein powder may be added to fluids such as non-fat milk or Lactaid milk or unsweetened Soy/Almond milk (limit to 35 grams added protein powder per serving) ° °Hydration °• Slowly increase the amount of water and other clear liquids as tolerated (See Acceptable Fluids) °• Slowly increase the amount of protein shake as tolerated  °•  Sip fluids slowly and throughout the day.  Do not use straws. °• May use sugar substitutes in small amounts (no more than 6 - 8 packets per day; i.e. Splenda) ° °Fluid Goal °• The first goal is to drink at least 8 ounces of protein shake/drink per day (or as directed by the nutritionist); some examples of protein shakes are Syntrax Nectar, Adkins Advantage, EAS Edge HP, and Unjury. See handout from pre-op Bariatric Education Class: °o Slowly increase the amount of protein shake you drink as tolerated °o You may find it easier to slowly sip shakes throughout the day °o It is important to get your proteins in first °• Your fluid goal is to drink 64 - 100 ounces of fluid daily °o It may take a few weeks to build up to this °• 32 oz (or more) should be clear liquids  °And  °• 32 oz (or more) should be full liquids (see below for examples) °• Liquids should not contain sugar, caffeine, or carbonation ° °Clear Liquids: °• Water or Sugar-free flavored water (i.e. Fruit H2O, Propel) °• Decaffeinated coffee or tea (sugar-free) °• Crystal Lite, Wyler’s Lite,  Minute Maid Lite °• Sugar-free Jell-O °• Bouillon or broth °• Sugar-free Popsicle:   *Less than 20 calories each; Limit 1 per day ° °Full Liquids: °Protein Shakes/Drinks + 2 choices per day of other full liquids °• Full liquids must be: °o No More Than 15 grams of Carbs per serving  °o No More Than 3 grams of Fat per serving °• Strained low-fat cream soup (except Cream of Potato or Tomato) °• Non-Fat milk °• Fat-free Lactaid Milk °• Unsweetened Soy Or Unsweetened Almond Milk °• Low Sugar yogurt (Dannon Lite & Fit, Greek yogurt; Oikos Triple Zero; Chobani Simply 100; Yoplait 100 calorie Greek - No Fruit on the Bottom) ° °  °Vitamins   and Minerals • Start 1 day after surgery unless otherwise directed by your surgeon °• 2 Chewable Bariatric Specific Multivitamin / Multimineral Supplement with iron (Example: Bariatric Advantage Multi EA) °• Chewable Calcium with Vitamin D-3 °(Example: 3 Chewable Calcium Plus 600 with Vitamin D-3) °o Take 500 mg three (3) times a day for a total of 1500 mg each day °o Do not take all 3 doses of calcium at one time as it may cause constipation, and you can only absorb 500 mg  at a time  °o Do not mix multivitamins containing iron with calcium supplements; take 2 hours apart °• Menstruating women and those with a history of anemia (a blood disease that causes weakness) may need extra iron °o Talk with your doctor to see if you need more iron °• Do not stop taking or change any vitamins or minerals until you talk to your dietitian or surgeon °• Your Dietitian and/or surgeon must approve all vitamin and mineral supplements °  °Activity and Exercise: Limit your physical activity as instructed by your doctor.  It is important to continue walking at home.  During this time, use these guidelines: °• Do not lift anything greater than ten (10) pounds for at least two (2) weeks °• Do not go back to work or drive until your surgeon says you can °• You may have sex when you feel comfortable  °o It is  VERY important for female patients to use a reliable birth control method; fertility often increases after surgery  °o All hormonal birth control will be ineffective for 30 days after surgery due to medications given during surgery a barrier method must be used. °o Do not get pregnant for at least 18 months °• Start exercising as soon as your doctor tells you that you can °o Make sure your doctor approves any physical activity °• Start with a simple walking program °• Walk 5-15 minutes each day, 7 days per week.  °• Slowly increase until you are walking 30-45 minutes per day °Consider joining our BELT program. [phone] or email [email or web address] °  °Special Instructions Things to remember: °• Use your CPAP when sleeping if this applies to you ° °• [facility] has two free Bariatric Surgery Support Groups that meet monthly °o The 3rd Thursday of each month, 6 pm, [facility]  °o The 2nd Friday of each month, 11:45 am in the private dining room in the basement of [facility] °• It is very important to keep all follow up appointments with your surgeon, dietitian, primary care physician, and behavioral health practitioner °• Routine follow up schedule with your surgeon include appointments at 2-3 weeks, 6-8 weeks, 6 months, and 1 year at a minimum.  Your surgeon may request to see you more often.   °o After the first year, please follow up with your bariatric surgeon and dietitian at least once a year in order to maintain best weight loss results °[facility]: [phone] °[facility]: [phone] °Bariatric Nurse Coordinator: [phone] °  °   Reviewed and Endorsed  °by [facility] Patient Education Committee, June, 2016 °Edits Approved: Aug, 2018 ° ° ° °

## 2020-02-26 NOTE — Anesthesia Procedure Notes (Signed)
Procedure Name: Intubation Date/Time: 02/26/2020 7:27 AM Performed by: Talbot Grumbling, CRNA Pre-anesthesia Checklist: Patient identified, Patient being monitored, Timeout performed, Emergency Drugs available and Suction available Patient Re-evaluated:Patient Re-evaluated prior to induction Oxygen Delivery Method: Circle System Utilized and Simple face mask Preoxygenation: Pre-oxygenation with 100% oxygen Induction Type: IV induction Ventilation: Mask ventilation without difficulty Laryngoscope Size: Mac and 4 Grade View: Grade I Tube type: Oral Tube size: 7.0 mm Number of attempts: 1 Airway Equipment and Method: stylet Placement Confirmation: ETT inserted through vocal cords under direct vision,  positive ETCO2 and breath sounds checked- equal and bilateral Secured at: 21 cm Tube secured with: Tape Dental Injury: Teeth and Oropharynx as per pre-operative assessment  Comments: Intubated by lauren ferm, srna under supervision of md and crna.

## 2020-02-26 NOTE — Transfer of Care (Signed)
Immediate Anesthesia Transfer of Care Note  Patient: Heidi Pace  Procedure(s) Performed: LAPAROSCOPIC ROUX-EN-Y GASTRIC BYPASS WITH UPPER ENDOSCOPY, ERAS Pathway (N/A Abdomen)  Patient Location: PACU  Anesthesia Type:General  Level of Consciousness: drowsy and responds to stimulation  Airway & Oxygen Therapy: Patient Spontanous Breathing and Patient connected to face mask oxygen  Post-op Assessment: Report given to RN and Post -op Vital signs reviewed and stable  Post vital signs: Reviewed and stable  Last Vitals:  Vitals Value Taken Time  BP    Temp    Pulse 80 02/26/20 0959  Resp 20 02/26/20 0959  SpO2 95 % 02/26/20 0959  Vitals shown include unvalidated device data.  Last Pain:  Vitals:   02/26/20 0628  TempSrc: Oral         Complications: No apparent anesthesia complications

## 2020-02-26 NOTE — Progress Notes (Signed)
Patient declines the use of nocturnal CPAP tonight. She is aware that she may call if she should change her mind. VSS at this time on room air

## 2020-02-27 LAB — CBC WITH DIFFERENTIAL/PLATELET
Abs Immature Granulocytes: 0.03 10*3/uL (ref 0.00–0.07)
Basophils Absolute: 0 10*3/uL (ref 0.0–0.1)
Basophils Relative: 0 %
Eosinophils Absolute: 0 10*3/uL (ref 0.0–0.5)
Eosinophils Relative: 0 %
HCT: 41.1 % (ref 36.0–46.0)
Hemoglobin: 13.2 g/dL (ref 12.0–15.0)
Immature Granulocytes: 0 %
Lymphocytes Relative: 8 %
Lymphs Abs: 0.9 10*3/uL (ref 0.7–4.0)
MCH: 30.6 pg (ref 26.0–34.0)
MCHC: 32.1 g/dL (ref 30.0–36.0)
MCV: 95.1 fL (ref 80.0–100.0)
Monocytes Absolute: 0.9 10*3/uL (ref 0.1–1.0)
Monocytes Relative: 8 %
Neutro Abs: 9.2 10*3/uL — ABNORMAL HIGH (ref 1.7–7.7)
Neutrophils Relative %: 84 %
Platelets: 234 10*3/uL (ref 150–400)
RBC: 4.32 MIL/uL (ref 3.87–5.11)
RDW: 12 % (ref 11.5–15.5)
WBC: 11 10*3/uL — ABNORMAL HIGH (ref 4.0–10.5)
nRBC: 0 % (ref 0.0–0.2)

## 2020-02-27 LAB — COMPREHENSIVE METABOLIC PANEL
ALT: 31 U/L (ref 0–44)
AST: 29 U/L (ref 15–41)
Albumin: 3.5 g/dL (ref 3.5–5.0)
Alkaline Phosphatase: 46 U/L (ref 38–126)
Anion gap: 7 (ref 5–15)
BUN: 18 mg/dL (ref 6–20)
CO2: 27 mmol/L (ref 22–32)
Calcium: 8.5 mg/dL — ABNORMAL LOW (ref 8.9–10.3)
Chloride: 106 mmol/L (ref 98–111)
Creatinine, Ser: 0.79 mg/dL (ref 0.44–1.00)
GFR calc Af Amer: >60 mL/min (ref >60)
GFR calc non Af Amer: >60 mL/min (ref >60)
Glucose, Bld: 149 mg/dL — ABNORMAL HIGH (ref 70–99)
Potassium: 5.1 mmol/L (ref 3.5–5.1)
Sodium: 140 mmol/L (ref 135–145)
Total Bilirubin: 0.4 mg/dL (ref 0.3–1.2)
Total Protein: 6.6 g/dL (ref 6.5–8.1)

## 2020-02-27 MED ORDER — METRONIDAZOLE 500 MG PO TABS
500.0000 mg | ORAL_TABLET | Freq: Three times a day (TID) | ORAL | 0 refills | Status: DC
Start: 2020-02-27 — End: 2020-04-06

## 2020-02-27 MED ORDER — TETRACYCLINE HCL 500 MG PO CAPS: 500.0000 mg | ORAL_CAPSULE | Freq: Four times a day (QID) | ORAL | 0 refills | Status: DC

## 2020-02-27 MED ORDER — BISMUTH 262 MG PO CHEW: 1.0000 | CHEWABLE_TABLET | Freq: Four times a day (QID) | ORAL | 0 refills | Status: AC

## 2020-02-27 MED ORDER — ACETAMINOPHEN 500 MG PO TABS
1000.0000 mg | ORAL_TABLET | Freq: Three times a day (TID) | ORAL | 0 refills | Status: AC
Start: 2020-02-27 — End: 2020-03-03

## 2020-02-27 MED ORDER — GABAPENTIN 100 MG PO CAPS
200.0000 mg | ORAL_CAPSULE | Freq: Two times a day (BID) | ORAL | 0 refills | Status: AC
Start: 2020-02-27 — End: ?

## 2020-02-27 MED ORDER — ONDANSETRON 4 MG PO TBDP: 4.0000 mg | ORAL_TABLET | Freq: Four times a day (QID) | ORAL | 0 refills | Status: AC | PRN

## 2020-02-27 NOTE — Progress Notes (Signed)
Patient alert and oriented, pain is controlled. Patient is tolerating fluids, advanced to protein shake today, patient is tolerating well. Reviewed Gastric Bypass discharge instructions with patient and patient is able to articulate understanding. Provided information on BELT program, Support Group and [facility] outpatient pharmacy. All questions answered, will continue to monitor.   Total fluid intake 600 Per dehydration protocol call back 03/01/2020

## 2020-02-27 NOTE — Progress Notes (Signed)
Patient alert and oriented, Post op day 1.  Provided support and encouragement.  Encouraged pulmonary toilet, ambulation and small sips of liquids.  Completed 12ounces of bari clear fluids and started protein shakes.  All questions answered.  Will continue to monitor. 

## 2020-02-27 NOTE — Discharge Summary (Signed)
Physician Discharge Summary  Heidi Pace YPP:[id number] DOB: 1966/07/17 DOA: 02/26/2020  PCP: Joseph Art, MD  Admit date: 02/26/2020 Discharge date: 02/27/2020  Recommendations for Outpatient Follow-up:  1.  (include homehealth, outpatient follow-up instructions, specific recommendations for PCP to follow-up on, etc.)  Follow-up Information    Heidi Pace, Heidi Bruce, MD. Go on 03/22/2020.   Specialty: General Surgery Why: at 1110 am.  Please arrive 15 minutes prior to appointment time.  Thank you Contact information: [place] [address] [phone]        Sur[facility], [place]. Go on 04/24/2020.   Specialty: General Surgery Why: at 130 pm with Dr Gurney Maxin.  Please arrive 15 minutes prior to appointment time.  Thank you Contact information: [place] [address] [phone]          Discharge Diagnoses:  Active Problems:   Morbid obesity (Heidi Pace)   Surgical Procedure: Roux-en-Y gastric bypass, upper endoscopy  Discharge Condition: Good Disposition: Home  Diet recommendation: Postoperative sleeve gastrectomy diet (liquids only)  Filed Weights   02/26/20 0628  Weight: (!) 137.2 kg     Hospital Course:  The patient was admitted after undergoing Roux-en-Y gastric bypass. POD 0 she ambulated well. POD 1 she was started on the water diet protocol and tolerated 300 ml in the first shift. Once meeting the water amount she was advanced to bariatric protein shakes which they tolerated and were discharged home POD 1.  Treatments: surgery: Roux-en-Y gastric bypass  Discharge Instructions  Discharge Instructions    Ambulate hourly while awake   Complete by: As directed    Call MD for:  difficulty breathing, headache or visual disturbances   Complete by: As directed    Call MD for:  persistant dizziness or light-headedness   Complete by: As directed    Call MD for:  persistant nausea and vomiting    Complete by: As directed    Call MD for:  redness, tenderness, or signs of infection (pain, swelling, redness, odor or green/yellow discharge around incision site)   Complete by: As directed    Call MD for:  severe uncontrolled pain   Complete by: As directed    Call MD for:  temperature >101 F   Complete by: As directed    Diet bariatric full liquid   Complete by: As directed    Discharge wound care:   Complete by: As directed    Remove Bandaids tomorrow, ok to shower tomorrow. Steristrips may fall off in 1-3 weeks.   Incentive spirometry   Complete by: As directed    Perform hourly while awake     Allergies as of 02/27/2020   No Known Allergies     Medication List    TAKE these medications   acetaminophen 500 MG tablet Commonly known as: TYLENOL Take 2 tablets (1,000 mg total) by mouth every 8 (eight) hours for 5 days.   ASPIRIN 81 PO Take 81 mg by mouth daily.   BIOTIN PO Take 1 tablet by mouth daily.   Bismuth 262 MG Chew Chew 1 tablet by mouth in the morning, at noon, in the evening, and at bedtime.   buPROPion 150 MG 24 hr tablet Commonly known as: WELLBUTRIN XL Take 150 mg by mouth every morning.   CALCIUM/VITAMIN D PO Take 1 tablet by mouth daily.   FLUoxetine 20 MG capsule Commonly known as: PROZAC Take 20 mg by mouth every morning.   gabapentin  100 MG capsule Commonly known as: NEURONTIN Take 2 capsules (200 mg total) by mouth every 12 (twelve) hours.   levothyroxine 25 MCG tablet Commonly known as: SYNTHROID Take 25 mcg by mouth daily before breakfast.   lisinopril-hydrochlorothiazide 10-12.5 MG tablet Commonly known as: ZESTORETIC Take 1 tablet by mouth daily. Notes to patient: Monitor Blood Pressure Daily and keep a log for primary care physician.  Monitor for symptoms of dehydration.  You may need to make changes to your medications with rapid weight loss.     metroNIDAZOLE 500 MG tablet Commonly known as: FLAGYL Take 1 tablet (500 mg  total) by mouth 3 (three) times daily.   multivitamin tablet Take 1 tablet by mouth daily.   omeprazole 20 MG capsule Commonly known as: PRILOSEC Take 20 mg by mouth daily.   ondansetron 4 MG disintegrating tablet Commonly known as: ZOFRAN-ODT Take 1 tablet (4 mg total) by mouth every 6 (six) hours as needed for nausea or vomiting.   tetracycline 500 MG capsule Commonly known as: SUMYCIN Take 1 capsule (500 mg total) by mouth 4 (four) times daily.   VITAMIN C PO Take 1 tablet by mouth daily.            Discharge Care Instructions  (From admission, onward)         Start     Ordered   02/27/20 0000  Discharge wound care:    Comments: Remove Bandaids tomorrow, ok to shower tomorrow. Steristrips may fall off in 1-3 weeks.   02/27/20 1115         Follow-up Information    Heidi Pace, De Blanch, MD. Go on 03/22/2020.   Specialty: General Surgery Why: at 1110 am.  Please arrive 15 minutes prior to appointment time.  Thank you Contact information: [address] [place] [address] [phone]        Surgery, [place]. Go on 04/24/2020.   Specialty: General Surgery Why: at 130 pm with Dr Heidi Pace.  Please arrive 15 minutes prior to appointment time.  Thank you Contact information: [address] [place] [address] [phone]            The results of significant diagnostics from this hospitalization (including imaging, microbiology, ancillary and laboratory) are listed below for reference.    Significant Diagnostic Studies: No results found.  Labs: Basic Metabolic Panel: Recent Labs  Lab 02/22/20 1333 02/27/20 0455  NA 140 140  K 4.7 5.1  CL 102 106  CO2 28 27  GLUCOSE 92 149*  BUN 27* 18  CREATININE 0.87 0.79  CALCIUM 9.1 8.5*   Liver Function Tests: Recent Labs  Lab 02/22/20 1333 02/27/20 0455  AST 18 29  ALT 17 31  ALKPHOS 60 46  BILITOT 0.7 0.4  PROT 7.6 6.6  ALBUMIN 4.3 3.5     CBC: Recent Labs  Lab 02/22/20 1333 02/26/20 1050 02/27/20 0455  WBC 8.9  --  11.0*  NEUTROABS 5.8  --  9.2*  HGB 14.6 14.4 13.2  HCT 45.2 45.2 41.1  MCV 95.2  --  95.1  PLT 277  --  234    CBG: No results for input(s): GLUCAP in the last 168 hours.  Active Problems:   Morbid obesity (HCC)   VTE plan: no chemical prophylaxis recommended ([email or web address])  Time coordinating discharge: 15 min

## 2020-02-27 NOTE — Progress Notes (Signed)
Pt alert and oriented.  Tolerating liquids. D/C instructions given. Pt d/cd home.

## 2020-03-05 ENCOUNTER — Telehealth (HOSPITAL_COMMUNITY): Payer: Self-pay

## 2020-03-05 NOTE — Telephone Encounter (Signed)
Patient called to discuss post bariatric surgery follow up questions.  See below:   1.  Tell me about your pain and pain management?pain controlled has not needed to use any additional medications  2.  Let's talk about fluid intake.  How much total fluid are you taking in?55 ounces day  3.  How much protein have you taken in the last 2 days?60 grams of protein  4.  Have you had nausea?  Tell me about when have experienced nausea and what you did to help?denies  5.  Has the frequency or color changed with your urine?urine light in color  6.  Tell me what your incisions look like?incisions wnl  7.  Have you been passing gas? BM?passing gas and had bm daily  8.  If a problem or question were to arise who would you call?  Do you know contact numbers for [facility], [facility], and [facility]?aware of how to contact all services  9.  How has the walking going?walking regularly  10.  How are your vitamins and calcium going?  How are you taking them? MVI and Calcium started thinking of switching to one a day Vitamin

## 2020-03-06 ENCOUNTER — Ambulatory Visit: Payer: Self-pay | Admitting: Family Medicine

## 2020-03-07 NOTE — Addendum Note (Signed)
Addendum  created 03/07/20 1617 by Jhonnie Garner, CRNA   Cosign clinical note

## 2020-03-12 ENCOUNTER — Other Ambulatory Visit: Payer: Self-pay

## 2020-03-12 ENCOUNTER — Encounter: Payer: Commercial Managed Care - PPO | Attending: General Surgery | Admitting: Skilled Nursing Facility1

## 2020-03-12 DIAGNOSIS — E669 Obesity, unspecified: Secondary | ICD-10-CM | POA: Diagnosis present

## 2020-03-13 NOTE — Progress Notes (Signed)
2 Week Post-Operative Nutrition Class   Patient was seen on 11/29/18 for Post-Operative Nutrition education at the [facility].    Surgery date: 02/26/2020 Surgery type: RYGB Start weight at [facility]: 299 Weight today: 287   Body Composition Scale 03/12/2020  Total Body Fat % 49.1  Visceral Fat 21  Fat-Free Mass % 50.8   Total Body Water % 39.9   Muscle-Mass lbs 31.1  Body Fat Displacement          Torso  lbs 87.5         Left Leg  lbs 17.5         Right Leg  lbs 17.5         Left Arm  lbs 8.7         Right Arm   lbs 8.7     The following the learning objectives were met by the patient during this course:  Identifies Phase 3 (Soft, High Proteins) Dietary Goals and will begin from 2 weeks post-operatively to 2 months post-operatively  Identifies appropriate sources of fluids and proteins   States protein recommendations and appropriate sources post-operatively  Identifies the need for appropriate texture modifications, mastication, and bite sizes when consuming solids  Identifies appropriate multivitamin and calcium sources post-operatively  Describes the need for physical activity post-operatively and will follow MD recommendations  States when to call healthcare provider regarding medication questions or post-operative complications   Handouts given during class include:  Phase 3A: Soft, High Protein Diet Handout   Follow-Up Plan: Patient will follow-up at [facility] in 6 weeks for 2 month post-op nutrition visit for diet advancement per MD.

## 2020-03-14 ENCOUNTER — Telehealth: Payer: Self-pay | Admitting: Skilled Nursing Facility1

## 2020-03-14 NOTE — Telephone Encounter (Signed)
Returned pts call.   Pt was confused about ounces.   Dietitian answered her questions.

## 2020-03-14 NOTE — Telephone Encounter (Signed)
If you took 30 minutes to eat it, chewed until applesauce consistency, sat up straight with your shoulders back, did not have any tv or phone on (no distractions), did not drink 15 minutes before and 30 minutes after, and took bites the size of a dime; It could just be you do not tolerate one of those food items. As I stated in class a lot of people cannot tolerate egg at this point.    Heidi Pace,  After I talked to you this morning I fixed myself 1 egg and 2 [place] sausage patties. I could not even eat half of it so I put it in the frig for later. Not even 10 minutes later I got sick twice and threw up both times. Did I do something wrong?  Thanks,  Heidi Pace

## 2020-03-18 ENCOUNTER — Telehealth: Payer: Self-pay | Admitting: Skilled Nursing Facility1

## 2020-03-18 NOTE — Telephone Encounter (Signed)
RD called pt to verify fluid intake once starting soft, solid proteins 2 week post-bariatric surgery.   Daily Fluid intake: 64+  Daily Protein intake: 60+  Concerns/issues:   No concerns  

## 2020-03-27 ENCOUNTER — Telehealth: Payer: Self-pay

## 2020-03-27 NOTE — Telephone Encounter (Signed)
Received the following message from [employer].  This pt returned her cpap today    She states she has not turned it on..............Heidi Pace ever

## 2020-04-06 ENCOUNTER — Emergency Department (HOSPITAL_COMMUNITY): Payer: Commercial Managed Care - PPO | Admitting: Anesthesiology

## 2020-04-06 ENCOUNTER — Other Ambulatory Visit: Payer: Self-pay

## 2020-04-06 ENCOUNTER — Encounter (HOSPITAL_COMMUNITY): Payer: Self-pay | Admitting: Emergency Medicine

## 2020-04-06 ENCOUNTER — Encounter (HOSPITAL_COMMUNITY)
Admission: EM | Disposition: A | Discharge status: Home / Self Care | Payer: Self-pay | Source: Home / Self Care | Attending: Emergency Medicine

## 2020-04-06 ENCOUNTER — Observation Stay (HOSPITAL_COMMUNITY)
Admission: EM | Admit: 2020-04-06 | Discharge: 2020-04-07 | Disposition: A | Discharge status: Home / Self Care | Payer: Commercial Managed Care - PPO | Attending: General Surgery | Admitting: General Surgery

## 2020-04-06 ENCOUNTER — Emergency Department (HOSPITAL_COMMUNITY): Payer: Commercial Managed Care - PPO

## 2020-04-06 DIAGNOSIS — F419 Anxiety disorder, unspecified: Secondary | ICD-10-CM | POA: Diagnosis not present

## 2020-04-06 DIAGNOSIS — K3580 Unspecified acute appendicitis: Secondary | ICD-10-CM | POA: Diagnosis present

## 2020-04-06 DIAGNOSIS — G4733 Obstructive sleep apnea (adult) (pediatric): Secondary | ICD-10-CM | POA: Diagnosis not present

## 2020-04-06 DIAGNOSIS — Z818 Family history of other mental and behavioral disorders: Secondary | ICD-10-CM | POA: Insufficient documentation

## 2020-04-06 DIAGNOSIS — Z87891 Personal history of nicotine dependence: Secondary | ICD-10-CM | POA: Diagnosis not present

## 2020-04-06 DIAGNOSIS — Z9884 Bariatric surgery status: Secondary | ICD-10-CM | POA: Insufficient documentation

## 2020-04-06 DIAGNOSIS — Z833 Family history of diabetes mellitus: Secondary | ICD-10-CM | POA: Diagnosis not present

## 2020-04-06 DIAGNOSIS — Z79899 Other long term (current) drug therapy: Secondary | ICD-10-CM | POA: Insufficient documentation

## 2020-04-06 DIAGNOSIS — Z809 Family history of malignant neoplasm, unspecified: Secondary | ICD-10-CM | POA: Insufficient documentation

## 2020-04-06 DIAGNOSIS — Z9049 Acquired absence of other specified parts of digestive tract: Secondary | ICD-10-CM | POA: Insufficient documentation

## 2020-04-06 DIAGNOSIS — K381 Appendicular concretions: Secondary | ICD-10-CM | POA: Insufficient documentation

## 2020-04-06 DIAGNOSIS — Z87442 Personal history of urinary calculi: Secondary | ICD-10-CM | POA: Insufficient documentation

## 2020-04-06 DIAGNOSIS — E039 Hypothyroidism, unspecified: Secondary | ICD-10-CM | POA: Diagnosis not present

## 2020-04-06 DIAGNOSIS — K353 Acute appendicitis with localized peritonitis, without perforation or gangrene: Secondary | ICD-10-CM | POA: Diagnosis present

## 2020-04-06 DIAGNOSIS — K3589 Other acute appendicitis without perforation or gangrene: Principal | ICD-10-CM | POA: Insufficient documentation

## 2020-04-06 DIAGNOSIS — G4730 Sleep apnea, unspecified: Secondary | ICD-10-CM | POA: Insufficient documentation

## 2020-04-06 DIAGNOSIS — Z8349 Family history of other endocrine, nutritional and metabolic diseases: Secondary | ICD-10-CM | POA: Diagnosis not present

## 2020-04-06 DIAGNOSIS — Z8249 Family history of ischemic heart disease and other diseases of the circulatory system: Secondary | ICD-10-CM | POA: Insufficient documentation

## 2020-04-06 DIAGNOSIS — I10 Essential (primary) hypertension: Secondary | ICD-10-CM | POA: Diagnosis not present

## 2020-04-06 DIAGNOSIS — K219 Gastro-esophageal reflux disease without esophagitis: Secondary | ICD-10-CM | POA: Insufficient documentation

## 2020-04-06 DIAGNOSIS — Z6841 Body Mass Index (BMI) 40.0 and over, adult: Secondary | ICD-10-CM | POA: Diagnosis not present

## 2020-04-06 DIAGNOSIS — M810 Age-related osteoporosis without current pathological fracture: Secondary | ICD-10-CM | POA: Insufficient documentation

## 2020-04-06 DIAGNOSIS — F329 Major depressive disorder, single episode, unspecified: Secondary | ICD-10-CM | POA: Insufficient documentation

## 2020-04-06 DIAGNOSIS — Z20822 Contact with and (suspected) exposure to covid-19: Secondary | ICD-10-CM | POA: Diagnosis not present

## 2020-04-06 HISTORY — PX: LAPAROSCOPIC APPENDECTOMY: SHX408

## 2020-04-06 LAB — I-STAT BETA HCG BLOOD, ED (MC, WL, AP ONLY): I-stat hCG, quantitative: <5 m[IU]/mL (ref <5)

## 2020-04-06 LAB — COMPREHENSIVE METABOLIC PANEL
ALT: 23 U/L (ref 0–44)
AST: 20 U/L (ref 15–41)
Albumin: 4 g/dL (ref 3.5–5.0)
Alkaline Phosphatase: 50 U/L (ref 38–126)
Anion gap: 15 (ref 5–15)
BUN: 16 mg/dL (ref 6–20)
CO2: 27 mmol/L (ref 22–32)
Calcium: 9.3 mg/dL (ref 8.9–10.3)
Chloride: 99 mmol/L (ref 98–111)
Creatinine, Ser: 0.84 mg/dL (ref 0.44–1.00)
GFR calc Af Amer: >60 mL/min (ref >60)
GFR calc non Af Amer: >60 mL/min (ref >60)
Glucose, Bld: 85 mg/dL (ref 70–99)
Potassium: 5.3 mmol/L — ABNORMAL HIGH (ref 3.5–5.1)
Sodium: 141 mmol/L (ref 135–145)
Total Bilirubin: 0.7 mg/dL (ref 0.3–1.2)
Total Protein: 7.4 g/dL (ref 6.5–8.1)

## 2020-04-06 LAB — CBC
HCT: 44.6 % (ref 36.0–46.0)
Hemoglobin: 14.7 g/dL (ref 12.0–15.0)
MCH: 30.9 pg (ref 26.0–34.0)
MCHC: 33 g/dL (ref 30.0–36.0)
MCV: 93.7 fL (ref 80.0–100.0)
Platelets: 246 10*3/uL (ref 150–400)
RBC: 4.76 MIL/uL (ref 3.87–5.11)
RDW: 12.9 % (ref 11.5–15.5)
WBC: 10.3 10*3/uL (ref 4.0–10.5)
nRBC: 0 % (ref 0.0–0.2)

## 2020-04-06 LAB — LIPASE, BLOOD: Lipase: 21 U/L (ref 11–51)

## 2020-04-06 LAB — SARS CORONAVIRUS 2 BY RT PCR (HOSPITAL ORDER, PERFORMED IN ~~LOC~~ HOSPITAL LAB): SARS Coronavirus 2: NEGATIVE

## 2020-04-06 SURGERY — APPENDECTOMY, LAPAROSCOPIC
Anesthesia: General

## 2020-04-06 MED ORDER — FENTANYL CITRATE (PF) 250 MCG/5ML IJ SOLN
INTRAMUSCULAR | Status: DC | PRN
  Administered 2020-04-06: 50 ug via INTRAVENOUS
  Administered 2020-04-06 (×2): 100 ug via INTRAVENOUS

## 2020-04-06 MED ORDER — ACETAMINOPHEN 500 MG PO TABS
1000.0000 mg | ORAL_TABLET | Freq: Four times a day (QID) | ORAL | Status: DC
  Administered 2020-04-07 (×2): 1000 mg via ORAL
  Filled 2020-04-06 (×2): qty 2

## 2020-04-06 MED ORDER — HYDROCODONE-ACETAMINOPHEN 5-325 MG PO TABS
1.0000 | ORAL_TABLET | ORAL | Status: DC | PRN
  Administered 2020-04-07 (×2): 2 via ORAL
  Filled 2020-04-06 (×2): qty 2

## 2020-04-06 MED ORDER — LIDOCAINE 2% (20 MG/ML) 5 ML SYRINGE
INTRAMUSCULAR | Status: DC | PRN
  Administered 2020-04-06: 100 mg via INTRAVENOUS

## 2020-04-06 MED ORDER — ROCURONIUM BROMIDE 10 MG/ML (PF) SYRINGE
PREFILLED_SYRINGE | INTRAVENOUS | Status: DC | PRN
  Administered 2020-04-06: 40 mg via INTRAVENOUS

## 2020-04-06 MED ORDER — SUCCINYLCHOLINE CHLORIDE 200 MG/10ML IV SOSY
PREFILLED_SYRINGE | INTRAVENOUS | Status: AC
  Filled 2020-04-06: qty 10

## 2020-04-06 MED ORDER — DIPHENHYDRAMINE HCL 50 MG/ML IJ SOLN: 25.0000 mg | Freq: Four times a day (QID) | INTRAMUSCULAR | Status: DC | PRN

## 2020-04-06 MED ORDER — ONDANSETRON HCL 4 MG/2ML IJ SOLN
INTRAMUSCULAR | Status: DC | PRN
  Administered 2020-04-06: 4 mg via INTRAVENOUS

## 2020-04-06 MED ORDER — FLUOXETINE HCL 20 MG PO CAPS
20.0000 mg | ORAL_CAPSULE | Freq: Every day | ORAL | Status: DC
  Administered 2020-04-07: 20 mg via ORAL
  Filled 2020-04-06: qty 1

## 2020-04-06 MED ORDER — BUPIVACAINE-EPINEPHRINE 0.5% -1:200000 IJ SOLN
INTRAMUSCULAR | Status: DC | PRN
  Administered 2020-04-06: 50 mL

## 2020-04-06 MED ORDER — IOHEXOL 9 MG/ML PO SOLN
ORAL | Status: AC
  Filled 2020-04-06: qty 500

## 2020-04-06 MED ORDER — LIDOCAINE 2% (20 MG/ML) 5 ML SYRINGE
INTRAMUSCULAR | Status: AC
  Filled 2020-04-06: qty 5

## 2020-04-06 MED ORDER — IOHEXOL 300 MG/ML  SOLN
100.0000 mL | Freq: Once | INTRAMUSCULAR | Status: AC | PRN
  Administered 2020-04-06: 100 mL via INTRAVENOUS

## 2020-04-06 MED ORDER — BUPROPION HCL ER (XL) 150 MG PO TB24
150.0000 mg | ORAL_TABLET | Freq: Every day | ORAL | Status: DC
  Administered 2020-04-07: 150 mg via ORAL
  Filled 2020-04-06: qty 1

## 2020-04-06 MED ORDER — PROPOFOL 10 MG/ML IV BOLUS
INTRAVENOUS | Status: DC | PRN
  Administered 2020-04-06: 180 mg via INTRAVENOUS

## 2020-04-06 MED ORDER — OXYCODONE HCL 5 MG PO TABS: 5.0000 mg | ORAL_TABLET | Freq: Once | ORAL | Status: DC | PRN

## 2020-04-06 MED ORDER — ONDANSETRON HCL 4 MG/2ML IJ SOLN
INTRAMUSCULAR | Status: AC
  Filled 2020-04-06: qty 2

## 2020-04-06 MED ORDER — DIPHENHYDRAMINE HCL 25 MG PO CAPS: 25.0000 mg | ORAL_CAPSULE | Freq: Four times a day (QID) | ORAL | Status: DC | PRN

## 2020-04-06 MED ORDER — SODIUM CHLORIDE 0.9% FLUSH: 3.0000 mL | Freq: Once | INTRAVENOUS | Status: DC

## 2020-04-06 MED ORDER — SODIUM CHLORIDE 0.9 % IV SOLN
2.0000 g | Freq: Once | INTRAVENOUS | Status: AC
  Administered 2020-04-06: 2 g via INTRAVENOUS
  Filled 2020-04-06: qty 20

## 2020-04-06 MED ORDER — HYDROMORPHONE HCL 1 MG/ML IJ SOLN
INTRAMUSCULAR | Status: AC
  Filled 2020-04-06: qty 1

## 2020-04-06 MED ORDER — HYDROMORPHONE HCL 1 MG/ML IJ SOLN: 0.2500 mg | INTRAMUSCULAR | Status: DC | PRN

## 2020-04-06 MED ORDER — MIDAZOLAM HCL 5 MG/5ML IJ SOLN
INTRAMUSCULAR | Status: DC | PRN
  Administered 2020-04-06: 2 mg via INTRAVENOUS

## 2020-04-06 MED ORDER — LACTATED RINGERS IR SOLN
Status: DC | PRN
  Administered 2020-04-06: 1000 mL

## 2020-04-06 MED ORDER — ONDANSETRON HCL 4 MG/2ML IJ SOLN
4.0000 mg | Freq: Once | INTRAMUSCULAR | Status: AC
  Administered 2020-04-06: 4 mg via INTRAVENOUS
  Filled 2020-04-06: qty 2

## 2020-04-06 MED ORDER — ROCURONIUM BROMIDE 10 MG/ML (PF) SYRINGE
PREFILLED_SYRINGE | INTRAVENOUS | Status: AC
  Filled 2020-04-06: qty 10

## 2020-04-06 MED ORDER — SUGAMMADEX SODIUM 500 MG/5ML IV SOLN
INTRAVENOUS | Status: AC
  Filled 2020-04-06: qty 5

## 2020-04-06 MED ORDER — SUCCINYLCHOLINE CHLORIDE 200 MG/10ML IV SOSY
PREFILLED_SYRINGE | INTRAVENOUS | Status: DC | PRN
  Administered 2020-04-06: 140 mg via INTRAVENOUS

## 2020-04-06 MED ORDER — DEXAMETHASONE SODIUM PHOSPHATE 10 MG/ML IJ SOLN
INTRAMUSCULAR | Status: AC
  Filled 2020-04-06: qty 1

## 2020-04-06 MED ORDER — FENTANYL CITRATE (PF) 250 MCG/5ML IJ SOLN
INTRAMUSCULAR | Status: AC
  Filled 2020-04-06: qty 5

## 2020-04-06 MED ORDER — HYDROMORPHONE HCL 1 MG/ML IJ SOLN
0.2500 mg | INTRAMUSCULAR | Status: DC | PRN
  Administered 2020-04-06 (×2): 0.5 mg via INTRAVENOUS

## 2020-04-06 MED ORDER — ONDANSETRON HCL 4 MG/2ML IJ SOLN: 4.0000 mg | Freq: Once | INTRAMUSCULAR | Status: DC | PRN

## 2020-04-06 MED ORDER — MIDAZOLAM HCL 2 MG/2ML IJ SOLN
INTRAMUSCULAR | Status: AC
  Filled 2020-04-06: qty 2

## 2020-04-06 MED ORDER — KCL IN DEXTROSE-NACL 20-5-0.45 MEQ/L-%-% IV SOLN
INTRAVENOUS | Status: DC
  Filled 2020-04-06: qty 1000

## 2020-04-06 MED ORDER — DEXAMETHASONE SODIUM PHOSPHATE 10 MG/ML IJ SOLN
INTRAMUSCULAR | Status: DC | PRN
  Administered 2020-04-06: 10 mg via INTRAVENOUS

## 2020-04-06 MED ORDER — MORPHINE SULFATE (PF) 2 MG/ML IV SOLN: 2.0000 mg | INTRAVENOUS | Status: DC | PRN

## 2020-04-06 MED ORDER — SIMETHICONE 80 MG PO CHEW: 40.0000 mg | CHEWABLE_TABLET | Freq: Four times a day (QID) | ORAL | Status: DC | PRN

## 2020-04-06 MED ORDER — SODIUM CHLORIDE (PF) 0.9 % IJ SOLN
INTRAMUSCULAR | Status: AC
  Filled 2020-04-06: qty 50

## 2020-04-06 MED ORDER — PROPOFOL 10 MG/ML IV BOLUS
INTRAVENOUS | Status: AC
  Filled 2020-04-06: qty 20

## 2020-04-06 MED ORDER — LEVOTHYROXINE SODIUM 25 MCG PO TABS
25.0000 ug | ORAL_TABLET | Freq: Every day | ORAL | Status: DC
  Administered 2020-04-07: 25 ug via ORAL
  Filled 2020-04-06: qty 1

## 2020-04-06 MED ORDER — KETOROLAC TROMETHAMINE 30 MG/ML IJ SOLN: 30.0000 mg | Freq: Once | INTRAMUSCULAR | Status: DC | PRN

## 2020-04-06 MED ORDER — BUPIVACAINE-EPINEPHRINE 0.5% -1:200000 IJ SOLN
INTRAMUSCULAR | Status: AC
  Filled 2020-04-06: qty 1

## 2020-04-06 MED ORDER — METRONIDAZOLE IN NACL 5-0.79 MG/ML-% IV SOLN
500.0000 mg | Freq: Once | INTRAVENOUS | Status: AC
  Administered 2020-04-06: 500 mg via INTRAVENOUS
  Filled 2020-04-06: qty 100

## 2020-04-06 MED ORDER — OXYCODONE HCL 5 MG/5ML PO SOLN: 5.0000 mg | Freq: Once | ORAL | Status: DC | PRN

## 2020-04-06 MED ORDER — LISINOPRIL-HYDROCHLOROTHIAZIDE 10-12.5 MG PO TABS: 1.0000 | ORAL_TABLET | Freq: Every day | ORAL | Status: DC

## 2020-04-06 MED ORDER — ONDANSETRON 4 MG PO TBDP: 4.0000 mg | ORAL_TABLET | Freq: Four times a day (QID) | ORAL | Status: DC | PRN

## 2020-04-06 MED ORDER — LISINOPRIL 10 MG PO TABS
10.0000 mg | ORAL_TABLET | Freq: Every day | ORAL | Status: DC
  Filled 2020-04-06: qty 1

## 2020-04-06 MED ORDER — HYDROCHLOROTHIAZIDE 12.5 MG PO CAPS
12.5000 mg | ORAL_CAPSULE | Freq: Every day | ORAL | Status: DC
  Filled 2020-04-06: qty 1

## 2020-04-06 MED ORDER — SUGAMMADEX SODIUM 500 MG/5ML IV SOLN
INTRAVENOUS | Status: DC | PRN
Start: 2020-04-06 — End: 2020-04-06
  Administered 2020-04-06: 300 mg via INTRAVENOUS

## 2020-04-06 MED ORDER — LACTATED RINGERS IV SOLN: INTRAVENOUS | Status: DC | PRN

## 2020-04-06 MED ORDER — PANTOPRAZOLE SODIUM 40 MG PO TBEC
40.0000 mg | DELAYED_RELEASE_TABLET | Freq: Every day | ORAL | Status: DC
  Administered 2020-04-07: 40 mg via ORAL
  Filled 2020-04-06: qty 1

## 2020-04-06 MED ORDER — ENOXAPARIN SODIUM 40 MG/0.4ML ~~LOC~~ SOLN
40.0000 mg | SUBCUTANEOUS | Status: DC
  Administered 2020-04-07: 40 mg via SUBCUTANEOUS
  Filled 2020-04-06: qty 0.4

## 2020-04-06 MED ORDER — DOCUSATE SODIUM 100 MG PO CAPS
100.0000 mg | ORAL_CAPSULE | Freq: Two times a day (BID) | ORAL | Status: DC
  Administered 2020-04-07 (×2): 100 mg via ORAL
  Filled 2020-04-06: qty 1

## 2020-04-06 SURGICAL SUPPLY — 40 items
APPLIER CLIP 5 13 M/L LIGAMAX5 (MISCELLANEOUS)
APPLIER CLIP ROT 10 11.4 M/L (STAPLE)
CABLE HIGH FREQUENCY MONO STRZ (ELECTRODE) ×2 IMPLANT
CHLORAPREP W/TINT 26 (MISCELLANEOUS) ×2 IMPLANT
CLIP APPLIE 5 13 M/L LIGAMAX5 (MISCELLANEOUS) IMPLANT
CLIP APPLIE ROT 10 11.4 M/L (STAPLE) IMPLANT
COVER WAND RF STERILE (DRAPES) IMPLANT
DECANTER SPIKE VIAL GLASS SM (MISCELLANEOUS) ×2 IMPLANT
DERMABOND ADVANCED (GAUZE/BANDAGES/DRESSINGS) ×1
DERMABOND ADVANCED .7 DNX12 (GAUZE/BANDAGES/DRESSINGS) ×1 IMPLANT
DRAPE LAPAROSCOPIC ABDOMINAL (DRAPES) IMPLANT
ELECT REM PT RETURN 15FT ADLT (MISCELLANEOUS) ×2 IMPLANT
GLOVE BIO SURGEON STRL SZ 6.5 (GLOVE) ×2 IMPLANT
GLOVE BIOGEL PI IND STRL 7.0 (GLOVE) ×1 IMPLANT
GLOVE BIOGEL PI INDICATOR 7.0 (GLOVE) ×1
GOWN STRL REUS W/TWL XL LVL3 (GOWN DISPOSABLE) ×4 IMPLANT
GRASPER SUT TROCAR 14GX15 (MISCELLANEOUS) ×2 IMPLANT
HANDLE STAPLE EGIA 4 XL (STAPLE) ×2 IMPLANT
IRRIG SUCT STRYKERFLOW 2 WTIP (MISCELLANEOUS) ×2
IRRIGATION SUCT STRKRFLW 2 WTP (MISCELLANEOUS) ×1 IMPLANT
KIT BASIN (CUSTOM PROCEDURE TRAY) ×2 IMPLANT
KIT TURNOVER KIT A (KITS) IMPLANT
MARKER SKIN DUAL TIP RULER LAB (MISCELLANEOUS) IMPLANT
PENCIL SMOKE EVACUATOR (MISCELLANEOUS) IMPLANT
POUCH SPECIMEN RETRIEVAL 10MM (ENDOMECHANICALS) ×2 IMPLANT
RELOAD EGIA 45 MED/THCK PURPLE (STAPLE) ×4 IMPLANT
RELOAD EGIA 45 TAN VASC (STAPLE) IMPLANT
RELOAD EGIA 60 MED/THCK PURPLE (STAPLE) IMPLANT
RELOAD EGIA 60 TAN VASC (STAPLE) ×2 IMPLANT
SCISSORS LAP 5X35 DISP (ENDOMECHANICALS) IMPLANT
SLEEVE XCEL OPT CAN 5 100 (ENDOMECHANICALS) ×2 IMPLANT
SUT VIC AB 2-0 SH 27 (SUTURE) ×1
SUT VIC AB 2-0 SH 27X BRD (SUTURE) ×1 IMPLANT
SUT VIC AB 4-0 PS2 27 (SUTURE) ×2 IMPLANT
SUT VICRYL 0 UR6 27IN ABS (SUTURE) ×2 IMPLANT
TOWEL OR 17X26 10 PK STRL BLUE (TOWEL DISPOSABLE) ×2 IMPLANT
TRAY FOLEY MTR SLVR 16FR STAT (SET/KITS/TRAYS/PACK) ×2 IMPLANT
TRAY LAPAROSCOPIC (CUSTOM PROCEDURE TRAY) ×2 IMPLANT
TROCAR BLADELESS OPT 5 100 (ENDOMECHANICALS) IMPLANT
TROCAR XCEL BLUNT TIP 100MML (ENDOMECHANICALS) ×2 IMPLANT

## 2020-04-06 NOTE — Progress Notes (Signed)
Pt from OR @ 1945 no Covid 19 test result posted yet, moved to isolation room. Consent signed

## 2020-04-06 NOTE — ED Provider Notes (Signed)
[facility]-EMERGENCY DEPT Provider Note   CSN: [id number] Arrival date & time: 04/06/20  1228     History Chief Complaint  Patient presents with   Abdominal Pain   Nausea    Heidi Pace is a 54 y.o. female w PMHx HTN, recent gastric bypass May 2021, presenting to the ED with complaint of 1 week of lower abdominal pain. Pain is mostly constant, nonradiating, worsening. Reports assoc nausea, emesis x1. Pain is worse with eating. Took tylenol without much relief. Denies urinary sx, fever, hematemesis, diarrhea.   The history is provided by the patient.       Past Medical History:  Diagnosis Date   Acid reflux    Ankle swelling    Anxiety    Depression    History of kidney stones    Hypertension    Hypothyroidism    Obesity    OSA on CPAP    Osteoporosis    Left knee    Patient Active Problem List   Diagnosis Date Noted   Morbid obesity (HCC) 02/26/2020   Obesity 11/30/2019    Past Surgical History:  Procedure Laterality Date   CESAREAN SECTION     CHOLECYSTECTOMY     DIAGNOSTIC LAPAROSCOPY     DILATION AND CURETTAGE OF UTERUS     GASTRIC ROUX-EN-Y N/A 02/26/2020   Procedure: LAPAROSCOPIC ROUX-EN-Y GASTRIC BYPASS WITH UPPER ENDOSCOPY, ERAS Pathway;  Surgeon: Kinsinger, De Blanch, MD;  Location: [facility];  Service: General;  Laterality: N/A;   SALIVARY STONE REMOVAL     TUBAL LIGATION       OB History   No obstetric history on file.     Family History  Problem Relation Age of Onset   Depression Mother    Diabetes Mother    Thyroid disease Mother    Hypertension Father    Melanoma Father    Depression Daughter     Social History   Tobacco Use   Smoking status: Former Smoker    Types: Cigarettes    Quit date: 2000    Years since quitting: 21.5   Smokeless tobacco: Never Used  Building services engineer Use: Never used  Substance Use Topics   Alcohol use: Yes    Comment: Wine/2 glasses per week    Drug use: Never    Home Medications Prior to Admission medications   Medication Sig Start Date End Date Taking? Authorizing Provider  Bismuth 262 MG CHEW Chew 1 tablet by mouth in the morning, at noon, in the evening, and at bedtime. Patient taking differently: Chew 1 tablet by mouth as needed (stomach relief).  02/27/20  Yes Kinsinger, De Blanch, MD  buPROPion (WELLBUTRIN XL) 150 MG 24 hr tablet Take 150 mg by mouth every morning.    Yes [provider]  CALCIUM CITRATE PO Take 500 mg by mouth 3 (three) times daily. Chewables   Yes [provider]  FLUoxetine (PROZAC) 20 MG capsule Take 20 mg by mouth every morning.  09/26/19  Yes [provider]  levothyroxine (SYNTHROID) 25 MCG tablet Take 25 mcg by mouth daily before breakfast.  10/11/19  Yes [provider]  lisinopril-hydrochlorothiazide (ZESTORETIC) 10-12.5 MG tablet Take 1 tablet by mouth daily. 10/11/19  Yes [provider]  Multiple Vitamin (MULTIVITAMIN) tablet Take 1 tablet by mouth daily.   Yes [provider]  omeprazole (PRILOSEC) 20 MG capsule Take 20 mg by mouth daily. 10/11/19  Yes [provider]  ondansetron (ZOFRAN-ODT)  4 MG disintegrating tablet Take 1 tablet (4 mg total) by mouth every 6 (six) hours as needed for nausea or vomiting. 02/27/20  Yes Kinsinger, De Blanch, MD  gabapentin (NEURONTIN) 100 MG capsule Take 2 capsules (200 mg total) by mouth every 12 (twelve) hours. Patient not taking: Reported on 04/06/2020 02/27/20   Kinsinger, De Blanch, MD    Allergies    Patient has no known allergies.  Review of Systems   Review of Systems  Gastrointestinal: Positive for abdominal pain, nausea and vomiting.  All other systems reviewed and are negative.   Physical Exam Updated Vital Signs BP 140/84 (BP Location: Right Arm)    Pulse 61    Temp 98.5 F (36.9 C) (Oral)    Resp 18    Ht 5\' 4"  (1.626 m)    Wt 124.3 kg    SpO2 98%    BMI 47.03 kg/m   Physical  Exam Vitals and nursing note reviewed.  Constitutional:      General: She is not in acute distress.    Appearance: She is well-developed. She is obese. She is not ill-appearing.  HENT:     Head: Normocephalic and atraumatic.  Eyes:     Conjunctiva/sclera: Conjunctivae normal.  Cardiovascular:     Rate and Rhythm: Normal rate and regular rhythm.  Pulmonary:     Effort: Pulmonary effort is normal. No respiratory distress.     Breath sounds: Normal breath sounds.  Abdominal:     General: Bowel sounds are normal.     Palpations: Abdomen is soft.     Tenderness: There is abdominal tenderness. There is no guarding or rebound.    Skin:    General: Skin is warm.  Neurological:     Mental Status: She is alert.  Psychiatric:        Behavior: Behavior normal.     ED Results / Procedures / Treatments   Labs (all labs ordered are listed, but only abnormal results are displayed) Labs Reviewed  COMPREHENSIVE METABOLIC PANEL - Abnormal; Notable for the following components:      Result Value   Potassium 5.3 (*)    All other components within normal limits  SARS CORONAVIRUS 2 BY RT PCR (HOSPITAL ORDER, PERFORMED IN  HOSPITAL LAB)  LIPASE, BLOOD  CBC  URINALYSIS, ROUTINE W REFLEX MICROSCOPIC  I-STAT BETA HCG BLOOD, ED ([facility], [facility], [facility] ONLY)    EKG None  Radiology CT Abdomen Pelvis W Contrast  Result Date: 04/06/2020 CLINICAL DATA:  Mid abdominal pain with nausea and vomiting x2 days. EXAM: CT ABDOMEN AND PELVIS WITH CONTRAST TECHNIQUE: Multidetector CT imaging of the abdomen and pelvis was performed using the standard protocol following bolus administration of intravenous contrast. CONTRAST:  06/07/2020 OMNIPAQUE IOHEXOL 300 MG/ML  SOLN COMPARISON:  None. FINDINGS: Lower chest: No acute abnormality. Hepatobiliary: No focal liver abnormality is seen. Status post cholecystectomy. No biliary dilatation. Pancreas: Unremarkable. No pancreatic ductal dilatation or surrounding inflammatory  changes. Spleen: Normal in size without focal abnormality. Adrenals/Urinary Tract: Adrenal glands are unremarkable. Kidneys are normal, without renal calculi, focal lesion, or hydronephrosis. Bladder is unremarkable. Stomach/Bowel: Surgical sutures are seen within the gastric region. A 4 mm appendicolith is seen within the lumen of the appendix. Very mild inflammation of the base of the appendix is seen with mild periappendiceal inflammatory fat stranding. No evidence of bowel dilatation. Vascular/Lymphatic: No significant vascular findings are present. No enlarged abdominal or pelvic lymph nodes. Reproductive: The uterus is normal in appearance. Curvilinear  suture like material is seen overlying the right adnexa. Other: No abdominal wall hernia or abnormality. No abdominopelvic ascites. Musculoskeletal: No acute or significant osseous findings. IMPRESSION: 1. Findings consistent with mild appendicitis. 2. Evidence of prior gastric bypass surgery. 3. Evidence of prior cholecystectomy. Electronically Signed   By: Aram Candela M.D.   On: 04/06/2020 17:12    Procedures Procedures (including critical care time)  Medications Ordered in ED Medications  sodium chloride flush (NS) 0.9 % injection 3 mL (0 mLs Intravenous Hold 04/06/20 1552)  sodium chloride (PF) 0.9 % injection (has no administration in time range)  iohexol (OMNIPAQUE) 9 MG/ML oral solution (has no administration in time range)  cefTRIAXone (ROCEPHIN) 2 g in sodium chloride 0.9 % 100 mL IVPB (has no administration in time range)    And  metroNIDAZOLE (FLAGYL) IVPB 500 mg (500 mg Intravenous New Bag/Given 04/06/20 1739)  ondansetron (ZOFRAN) injection 4 mg (4 mg Intravenous Given 04/06/20 1638)  iohexol (OMNIPAQUE) 300 MG/ML solution 100 mL (100 mLs Intravenous Contrast Given 04/06/20 1653)    ED Course  I have reviewed the triage vital signs and the nursing notes.  Pertinent labs & imaging results that were available during my care of the  patient were reviewed by me and considered in my medical decision making (see chart for details).    MDM Rules/Calculators/A&P                          Patient presenting with 1 week of lower abdominal pain with associated nausea and vomiting x1.  She had recent gastric bypass surgery in May, no postoperative complications reported.  On exam she is well-appearing and in no distress.  Her abdomen is tender periumbilical region and right lower quadrant.  She is afebrile with stable vital signs.  She is not requesting any pain medication, Zofran given for nausea.  CT scan ordered for further evaluation.  CT scan with findings consistent with a mild appendicitis, no perforation or abscess.  Rocephin and Flagyl ordered.  Patient's pain remains controlled at this time.  Consulted with general surgery, Dr. Maisie Fus, accepting care. Appreciate consult.   Final Clinical Impression(s) / ED Diagnoses Final diagnoses:  Acute appendicitis with localized peritonitis, without perforation, abscess, or gangrene    Rx / DC Orders ED Discharge Orders    None       Leyland Kenna, [place] N, PA-C 04/06/20 1839    Derwood Kaplan, MD 04/08/20 [phone]

## 2020-04-06 NOTE — Plan of Care (Signed)

## 2020-04-06 NOTE — Transfer of Care (Signed)
Immediate Anesthesia Transfer of Care Note  Patient: Heidi Pace  Procedure(s) Performed: APPENDECTOMY LAPAROSCOPIC (N/A )  Patient Location: PACU  Anesthesia Type:General  Level of Consciousness: awake, alert , oriented and patient cooperative  Airway & Oxygen Therapy: Patient Spontanous Breathing and Patient connected to face mask oxygen  Post-op Assessment: Report given to RN and Post -op Vital signs reviewed and stable  Post vital signs: Reviewed and stable  Last Vitals:  Vitals Value Taken Time  BP 146/65 04/06/20 2145  Temp    Pulse 74 04/06/20 2147  Resp 24 04/06/20 2147  SpO2 99 % 04/06/20 2147  Vitals shown include unvalidated device data.  Last Pain:  Vitals:   04/06/20 1549  TempSrc: Oral  PainSc:          Complications: No complications documented.

## 2020-04-06 NOTE — Op Note (Signed)
Heidi Pace [id number]   PRE-OPERATIVE DIAGNOSIS:  appendicitis  POST-OPERATIVE DIAGNOSIS:  Acute appedicitis   Procedure(s): APPENDECTOMY LAPAROSCOPIC   Surgeon(s): Romie Levee, MD  ASSISTANT: none   ANESTHESIA:   local and general  EBL:   10 ml  Delay start of Pharmacological VTE agent (>24hrs) due to surgical blood loss or risk of bleeding:  no  DRAINS: none   SPECIMEN:  Source of Specimen:  appendix  DISPOSITION OF SPECIMEN:  PATHOLOGY  COUNTS:  YES  PLAN OF CARE: Admit for overnight observation  PATIENT DISPOSITION:  PACU - hemodynamically stable.   INDICATIONS: Patient with concerning symptoms & work up suspicious for appendicitis.  Surgery was recommended:  The anatomy & physiology of the digestive tract was discussed.  The pathophysiology of appendicitis was discussed.  Natural history risks without surgery was discussed.   I feel the risks of no intervention will lead to serious problems that outweigh the operative risks; therefore, I recommended diagnostic laparoscopy with removal of appendix to remove the pathology.  Laparoscopic & open techniques were discussed.   I noted a good likelihood this will help address the problem.    Risks such as bleeding, infection, abscess, leak, reoperation, possible ostomy, hernia, heart attack, death, and other risks were discussed.  Goals of post-operative recovery were discussed as well.  We will work to minimize complications.  Questions were answered.  The patient expresses understanding & wishes to proceed with surgery.  OR FINDINGS: acute appendicitis, ectopic IUD (entire device outside the uterus)  DESCRIPTION:   The patient was identified & brought into the operating room. The patient was positioned supine with left arm tucked. SCDs were active during the entire case. The patient underwent general anesthesia without any difficulty.  A foley catheter was inserted under sterile conditions. The abdomen was prepped  and draped in a sterile fashion. A Surgical Timeout confirmed our plan.   I made a vertical incision through the inferior umbilical fold.  I made a nick in the infraumbilical fascia and confirmed peritoneal entry.  I placed a stay suture and then the [facility] port.  We induced carbon dioxide insufflation.  Camera inspection revealed no injury.  I placed additional ports under direct laparoscopic visualization.  I mobilized the terminal ileum to proximal ascending colon in a lateral to medial fashion.  I took care to avoid injuring any retroperitoneal structures.   I freed the appendix off its attachments to the ascending colon and cecal mesentery.  I elevated the appendix.  I was able to free off the base of the appendix, which was still viable.  I stapled the appendix off the cecum using a laparoscopic purple load stapler x2.  I took a healthy cuff viable cecum. I skeletonized & ligated the mesoappendix with a vascular load stapler.   I placed the appendix inside an EndoCatch bag and removed out the [place] port.  I did copious irrigation. Hemostasis was good in the mesoappendix, colon mesentery, and retroperitoneum. Staple line was intact on the cecum with no bleeding. I washed out the pelvis, retrohepatic space and right paracolic gutter.  Hemostasis is good. There was no perforation or injury.  Because the area cleaned up well after irrigation, I did not place a drain.  I evaluated the abdomen and it was noted her IUD had migrated out the top of her uterus.  Intraoperative photos were taken for later reference.  I aspirated the carbon dioxide. I removed the ports. I closed the umbilical fascia site using  a 0 Vicryl stitch. I closed skin using 4-0 vicryl stitch.  Sterile dressings were applied.  Patient was extubated and sent to the recovery room.  I discussed the operative findings with the patient's family. I suspect the patient is going used in the hospital at least overnight and will need antibiotics  for 0 days. Questions answered. They expressed understanding and appreciation.

## 2020-04-06 NOTE — Anesthesia Preprocedure Evaluation (Addendum)
Anesthesia Evaluation  Patient identified by MRN, date of birth, ID band Patient awake    Reviewed: Allergy & Precautions, NPO status , Patient's Chart, lab work & pertinent test results  Airway Mallampati: II  TM Distance: >3 FB Neck ROM: Full    Dental no notable dental hx. (+) Teeth Intact, Dental Advisory Given   Pulmonary sleep apnea and Continuous Positive Airway Pressure Ventilation , former smoker,    Pulmonary exam normal breath sounds clear to auscultation       Cardiovascular Exercise Tolerance: Good hypertension, Pt. on medications Normal cardiovascular exam Rhythm:Regular Rate:Normal  12/05/19 EKG SR r 81   Neuro/Psych Anxiety Depression    GI/Hepatic Neg liver ROS, GERD  Medicated and Controlled,  Endo/Other  Hypothyroidism Morbid obesity  Renal/GU negative Renal ROSLab Results      Component                Value               Date                      CREATININE               0.84                04/06/2020                BUN                      16                  04/06/2020                NA                       141                 04/06/2020                K                        5.3 (H)             04/06/2020                CL                       99                  04/06/2020                CO2                      27                  04/06/2020                Musculoskeletal negative musculoskeletal ROS (+)   Abdominal (+) + obese,   Peds  Hematology Lab Results      Component                Value               Date                      WBC  10.3                04/06/2020                HGB                      14.7                04/06/2020                HCT                      44.6                04/06/2020                MCV                      93.7                04/06/2020                PLT                      246                 04/06/2020               Anesthesia Other Findings   Reproductive/Obstetrics                            Anesthesia Physical Anesthesia Plan  ASA: III and emergent  Anesthesia Plan: General   Post-op Pain Management:    Induction: Intravenous, Rapid sequence and Cricoid pressure planned  PONV Risk Score and Plan: 4 or greater and Treatment may vary due to age or medical condition, Ondansetron, Dexamethasone and Midazolam  Airway Management Planned: Oral ETT  Additional Equipment: None  Intra-op Plan:   Post-operative Plan: Extubation in OR  Informed Consent: I have reviewed the patients History and Physical, chart, labs and discussed the procedure including the risks, benefits and alternatives for the proposed anesthesia with the patient or authorized representative who has indicated his/her understanding and acceptance.     Dental advisory given  Plan Discussed with: CRNA  Anesthesia Plan Comments: (54 y/0 S/P gastric bypass 5/24 now presents w acute apendicitis)       Anesthesia Quick Evaluation

## 2020-04-06 NOTE — ED Triage Notes (Signed)
Patient here from home reporting mid abd pain, nausea, vomiting x2 days. . Reports that she had gastric bypass surgery back in May.

## 2020-04-06 NOTE — Anesthesia Procedure Notes (Signed)
Procedure Name: Intubation Date/Time: 04/06/2020 8:35 PM Performed by: Elyn Peers, CRNA Pre-anesthesia Checklist: Patient identified, Emergency Drugs available, Suction available, Patient being monitored and Timeout performed Patient Re-evaluated:Patient Re-evaluated prior to induction Oxygen Delivery Method: Circle system utilized Preoxygenation: Pre-oxygenation with 100% oxygen Induction Type: IV induction and Rapid sequence Laryngoscope Size: Miller and 3 Grade View: Grade I Tube type: Oral Tube size: 7.0 mm Number of attempts: 1 Airway Equipment and Method: Stylet Placement Confirmation: ETT inserted through vocal cords under direct vision,  positive ETCO2 and breath sounds checked- equal and bilateral Secured at: 22 cm Tube secured with: Tape Dental Injury: Teeth and Oropharynx as per pre-operative assessment

## 2020-04-06 NOTE — H&P (Signed)
CC: abd pain   HPI: Heidi Pace is a 54 y.o. female w PMHx HTN, recent gastric bypass May 2021, presenting to the ED with complaint of 1 week of lower abdominal pain. Pain is mostly constant, nonradiating, worsening. Reports assoc nausea, emesis x1.   Past Medical History:  Diagnosis Date  . Acid reflux   . Ankle swelling   . Anxiety   . Depression   . History of kidney stones   . Hypertension   . Hypothyroidism   . Obesity   . OSA on CPAP   . Osteoporosis    Left knee    Past Surgical History:  Procedure Laterality Date  . CESAREAN SECTION    . CHOLECYSTECTOMY    . DIAGNOSTIC LAPAROSCOPY    . DILATION AND CURETTAGE OF UTERUS    . GASTRIC ROUX-EN-Y N/A 02/26/2020   Procedure: LAPAROSCOPIC ROUX-EN-Y GASTRIC BYPASS WITH UPPER ENDOSCOPY, ERAS Pathway;  Surgeon: Kinsinger, De Blanch, MD;  Location: [facility];  Service: General;  Laterality: N/A;  . SALIVARY STONE REMOVAL    . TUBAL LIGATION      Family History  Problem Relation Age of Onset  . Depression Mother   . Diabetes Mother   . Thyroid disease Mother   . Hypertension Father   . Melanoma Father   . Depression Daughter     Social:  reports that she quit smoking about 21 years ago. Her smoking use included cigarettes. She has never used smokeless tobacco. She reports current alcohol use. She reports that she does not use drugs.  Allergies: No Known Allergies  Medications: I have reviewed the patient's current medications.  Results for orders placed or performed during the hospital encounter of 04/06/20 (from the past 48 hour(s))  Lipase, blood     Status: None   Collection Time: 04/06/20 12:50 PM  Result Value Ref Range   Lipase 21 11 - 51 U/L    Comment: Performed at [facility], [address] [place], [place] [address]  Comprehensive metabolic panel     Status: Abnormal   Collection Time: 04/06/20 12:50 PM  Result Value Ref Range   Sodium 141 135 - 145 mmol/L   Potassium 5.3 (H) 3.5  - 5.1 mmol/L   Chloride 99 98 - 111 mmol/L   CO2 27 22 - 32 mmol/L   Glucose, Bld 85 70 - 99 mg/dL    Comment: Glucose reference range applies only to samples taken after fasting for at least 8 hours.   BUN 16 6 - 20 mg/dL   Creatinine, Ser 9.20 0.44 - 1.00 mg/dL   Calcium 9.3 8.9 - 10.0 mg/dL   Total Protein 7.4 6.5 - 8.1 g/dL   Albumin 4.0 3.5 - 5.0 g/dL   AST 20 15 - 41 U/L   ALT 23 0 - 44 U/L   Alkaline Phosphatase 50 38 - 126 U/L   Total Bilirubin 0.7 0.3 - 1.2 mg/dL   GFR calc non Af Amer >60 >60 mL/min   GFR calc Af Amer >60 >60 mL/min   Anion gap 15 5 - 15    Comment: Performed at [facility], [address], [place], [place] [address]  CBC     Status: None   Collection Time: 04/06/20 12:50 PM  Result Value Ref Range   WBC 10.3 4.0 - 10.5 K/uL   RBC 4.76 3.87 - 5.11 MIL/uL   Hemoglobin 14.7 12.0 - 15.0 g/dL   HCT 75.8 36 -  46 %   MCV 93.7 80.0 - 100.0 fL   MCH 30.9 26.0 - 34.0 pg   MCHC 33.0 30.0 - 36.0 g/dL   RDW 72.512.9 36.611.5 - 44.015.5 %   Platelets 246 150 - 400 K/uL   nRBC 0.0 0.0 - 0.2 %    Comment: Performed at [facility], [address] [place], [place] [id number]  I-Stat beta hCG blood, ED     Status: None   Collection Time: 04/06/20 12:57 PM  Result Value Ref Range   I-stat hCG, quantitative <5.0 <5 mIU/mL   Comment 3            Comment:   GEST. AGE      CONC.  (mIU/mL)   <=1 WEEK        5 - 50     2 WEEKS       50 - 500     3 WEEKS       100 - 10,000     4 WEEKS     1,000 - 30,000        FEMALE AND NON-PREGNANT FEMALE:     LESS THAN 5 mIU/mL     CT Abdomen Pelvis W Contrast  Result Date: 04/06/2020 CLINICAL DATA:  Mid abdominal pain with nausea and vomiting x2 days. EXAM: CT ABDOMEN AND PELVIS WITH CONTRAST TECHNIQUE: Multidetector CT imaging of the abdomen and pelvis was performed using the standard protocol following bolus administration of intravenous contrast. CONTRAST:  100mL OMNIPAQUE IOHEXOL 300 MG/ML  SOLN  COMPARISON:  None. FINDINGS: Lower chest: No acute abnormality. Hepatobiliary: No focal liver abnormality is seen. Status post cholecystectomy. No biliary dilatation. Pancreas: Unremarkable. No pancreatic ductal dilatation or surrounding inflammatory changes. Spleen: Normal in size without focal abnormality. Adrenals/Urinary Tract: Adrenal glands are unremarkable. Kidneys are normal, without renal calculi, focal lesion, or hydronephrosis. Bladder is unremarkable. Stomach/Bowel: Surgical sutures are seen within the gastric region. A 4 mm appendicolith is seen within the lumen of the appendix. Very mild inflammation of the base of the appendix is seen with mild periappendiceal inflammatory fat stranding. No evidence of bowel dilatation. Vascular/Lymphatic: No significant vascular findings are present. No enlarged abdominal or pelvic lymph nodes. Reproductive: The uterus is normal in appearance. Curvilinear suture like material is seen overlying the right adnexa. Other: No abdominal wall hernia or abnormality. No abdominopelvic ascites. Musculoskeletal: No acute or significant osseous findings. IMPRESSION: 1. Findings consistent with mild appendicitis. 2. Evidence of prior gastric bypass surgery. 3. Evidence of prior cholecystectomy. Electronically Signed   By: Aram Candelahaddeus  Houston M.D.   On: 04/06/2020 17:12    ROS - all of the below systems have been reviewed with the patient and positives are indicated with bold text General: chills, fever or night sweats Eyes: blurry vision or double vision ENT: epistaxis or sore throat Allergy/Immunology: itchy/watery eyes or nasal congestion Hematologic/Lymphatic: bleeding problems, blood clots or swollen lymph nodes Endocrine: temperature intolerance or unexpected weight changes Breast: new or changing breast lumps or nipple discharge Resp: cough, shortness of breath, or wheezing CV: chest pain or dyspnea on exertion GI: as per HPI GU: dysuria, trouble voiding, or  hematuria MSK: joint pain or joint stiffness Neuro: TIA or stroke symptoms Derm: pruritus and skin lesion changes Psych: anxiety and depression  PE Blood pressure 128/62, pulse 61, temperature 98.5 F (36.9 C), temperature source Oral, resp. rate 14, height 5\' 4"  (1.626 m), weight 124.3 kg, SpO2 100 %. Constitutional: NAD; conversant; no deformities Eyes:  Moist conjunctiva; no lid lag; anicteric; PERRL Neck: Trachea midline; no thyromegaly Lungs: Normal respiratory effort; no tactile fremitus CV: RRR; no palpable thrills; no pitting edema GI: Abd TTP RLQ; no palpable hepatosplenomegaly MSK: Normal range of motion of extremities; no clubbing/cyanosis Psychiatric: Appropriate affect; alert and oriented x3 Lymphatic: No palpable cervical or axillary lymphadenopathy  Results for orders placed or performed during the hospital encounter of 04/06/20 (from the past 48 hour(s))  Lipase, blood     Status: None   Collection Time: 04/06/20 12:50 PM  Result Value Ref Range   Lipase 21 11 - 51 U/L    Comment: Performed at [facility], [address] [place], [place] [address]  Comprehensive metabolic panel     Status: Abnormal   Collection Time: 04/06/20 12:50 PM  Result Value Ref Range   Sodium 141 135 - 145 mmol/L   Potassium 5.3 (H) 3.5 - 5.1 mmol/L   Chloride 99 98 - 111 mmol/L   CO2 27 22 - 32 mmol/L   Glucose, Bld 85 70 - 99 mg/dL    Comment: Glucose reference range applies only to samples taken after fasting for at least 8 hours.   BUN 16 6 - 20 mg/dL   Creatinine, Ser 6.16 0.44 - 1.00 mg/dL   Calcium 9.3 8.9 - 07.3 mg/dL   Total Protein 7.4 6.5 - 8.1 g/dL   Albumin 4.0 3.5 - 5.0 g/dL   AST 20 15 - 41 U/L   ALT 23 0 - 44 U/L   Alkaline Phosphatase 50 38 - 126 U/L   Total Bilirubin 0.7 0.3 - 1.2 mg/dL   GFR calc non Af Amer >60 >60 mL/min   GFR calc Af Amer >60 >60 mL/min   Anion gap 15 5 - 15    Comment: Performed at [facility], [address], [place], [place] [address]  CBC     Status: None   Collection Time: 04/06/20 12:50 PM  Result Value Ref Range   WBC 10.3 4.0 - 10.5 K/uL   RBC 4.76 3.87 - 5.11 MIL/uL   Hemoglobin 14.7 12.0 - 15.0 g/dL   HCT 69.4 36 - 46 %   MCV 93.7 80.0 - 100.0 fL   MCH 30.9 26.0 - 34.0 pg   MCHC 33.0 30.0 - 36.0 g/dL   RDW 85.4 62.7 - 03.5 %   Platelets 246 150 - 400 K/uL   nRBC 0.0 0.0 - 0.2 %    Comment: Performed at [facility], [address] [place], [place] [address]  I-Stat beta hCG blood, ED     Status: None   Collection Time: 04/06/20 12:57 PM  Result Value Ref Range   I-stat hCG, quantitative <5.0 <5 mIU/mL   Comment 3            Comment:   GEST. AGE      CONC.  (mIU/mL)   <=1 WEEK        5 - 50     2 WEEKS       50 - 500     3 WEEKS       100 - 10,000     4 WEEKS     1,000 - 30,000        FEMALE AND NON-PREGNANT FEMALE:     LESS THAN 5 mIU/mL     CT Abdomen Pelvis W Contrast  Result Date: 04/06/2020 CLINICAL DATA:  Mid abdominal pain with nausea and  vomiting x2 days. EXAM: CT ABDOMEN AND PELVIS WITH CONTRAST TECHNIQUE: Multidetector CT imaging of the abdomen and pelvis was performed using the standard protocol following bolus administration of intravenous contrast. CONTRAST:  OMNIPAQUE IOHEXOL 300 MG/ML  SOLN COMPARISON:  None. FINDINGS: Lower chest: No acute abnormality. Hepatobiliary: No focal liver abnormality is seen. Status post cholecystectomy. No biliary dilatation. Pancreas: Unremarkable. No pancreatic ductal dilatation or surrounding inflammatory changes. Spleen: Normal in size without focal abnormality. Adrenals/Urinary Tract: Adrenal glands are unremarkable. Kidneys are normal, without renal calculi, focal lesion, or hydronephrosis. Bladder is unremarkable. Stomach/Bowel: Surgical sutures are seen within the gastric region. A 4 mm appendicolith is seen within the lumen of the appendix. Very mild inflammation of the base of the appendix is seen  with mild periappendiceal inflammatory fat stranding. No evidence of bowel dilatation. Vascular/Lymphatic: No significant vascular findings are present. No enlarged abdominal or pelvic lymph nodes. Reproductive: The uterus is normal in appearance. Curvilinear suture like material is seen overlying the right adnexa. Other: No abdominal wall hernia or abnormality. No abdominopelvic ascites. Musculoskeletal: No acute or significant osseous findings. IMPRESSION: 1. Findings consistent with mild appendicitis. 2. Evidence of prior gastric bypass surgery. 3. Evidence of prior cholecystectomy. Electronically Signed   By: Aram Candela M.D.   On: 04/06/2020 17:12     A/P: CAIRO LINGENFELTER is an 54 y.o. female with mild acute appendicitis.  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  We discussed laparoscopic appendectomy in detail.  We discussed an increased risk of damage to adjacent bowel and difficulty with surgery due to adhesions from prior surgery 1 month ago.  We discussed risk of bleeding and infection.  We discussed the risk of anastomotic leak and incisional hernia.  All questions were answered.   Vanita Panda, MD  Colorectal and General Surgery [facility]

## 2020-04-06 NOTE — Anesthesia Postprocedure Evaluation (Signed)
Anesthesia Post Note  Patient: Heidi Pace  Procedure(s) Performed: APPENDECTOMY LAPAROSCOPIC (N/A )     Patient location during evaluation: PACU Anesthesia Type: General Level of consciousness: awake and alert Pain management: pain level controlled Vital Signs Assessment: post-procedure vital signs reviewed and stable Respiratory status: spontaneous breathing, nonlabored ventilation, respiratory function stable and patient connected to nasal cannula oxygen Cardiovascular status: blood pressure returned to baseline and stable Postop Assessment: no apparent nausea or vomiting Anesthetic complications: no   No complications documented.  Last Vitals:  Vitals:   04/06/20 2215 04/06/20 2217  BP: (!) 117/57   Pulse: 65 63  Resp: 16 14  Temp:    SpO2: 97% 94%    Last Pain:  Vitals:   04/06/20 2217  TempSrc:   PainSc: 8                  Trevor Iha

## 2020-04-07 ENCOUNTER — Encounter (HOSPITAL_COMMUNITY): Payer: Self-pay | Admitting: General Surgery

## 2020-04-07 MED ORDER — DOCUSATE SODIUM 100 MG PO CAPS: 100.0000 mg | ORAL_CAPSULE | Freq: Two times a day (BID) | ORAL | 0 refills | Status: AC

## 2020-04-07 MED ORDER — HYDROCODONE-ACETAMINOPHEN 5-325 MG PO TABS: 1.0000 | ORAL_TABLET | Freq: Four times a day (QID) | ORAL | 0 refills | Status: AC | PRN

## 2020-04-07 NOTE — Plan of Care (Signed)
  Problem: Education: Goal: Knowledge of General Education information will improve Description: Including pain rating scale, medication(s)/side effects and non-pharmacologic comfort measures 04/07/2020 1650 by Iantha Fallen, RN Outcome: Adequate for Discharge 04/07/2020 1650 by Iantha Fallen, RN Outcome: Progressing   Problem: Health Behavior/Discharge Planning: Goal: Ability to manage health-related needs will improve 04/07/2020 1650 by Iantha Fallen, RN Outcome: Adequate for Discharge 04/07/2020 1650 by Iantha Fallen, RN Outcome: Progressing   Problem: Clinical Measurements: Goal: Ability to maintain clinical measurements within normal limits will improve 04/07/2020 1650 by Iantha Fallen, RN Outcome: Adequate for Discharge 04/07/2020 1650 by Iantha Fallen, RN Outcome: Progressing Goal: Will remain free from infection 04/07/2020 1650 by Iantha Fallen, RN Outcome: Adequate for Discharge 04/07/2020 1650 by Iantha Fallen, RN Outcome: Progressing Goal: Diagnostic test results will improve 04/07/2020 1650 by Iantha Fallen, RN Outcome: Adequate for Discharge 04/07/2020 1650 by Iantha Fallen, RN Outcome: Progressing Goal: Respiratory complications will improve 04/07/2020 1650 by Iantha Fallen, RN Outcome: Adequate for Discharge 04/07/2020 1650 by Iantha Fallen, RN Outcome: Progressing Goal: Cardiovascular complication will be avoided 04/07/2020 1650 by Iantha Fallen, RN Outcome: Adequate for Discharge 04/07/2020 1650 by Iantha Fallen, RN Outcome: Progressing   Problem: Activity: Goal: Risk for activity intolerance will decrease 04/07/2020 1650 by Iantha Fallen, RN Outcome: Adequate for Discharge 04/07/2020 1650 by Iantha Fallen, RN Outcome: Progressing   Problem: Nutrition: Goal: Adequate nutrition will be maintained 04/07/2020 1650 by Iantha Fallen, RN Outcome: Adequate for Discharge 04/07/2020 1650 by Iantha Fallen, RN Outcome: Progressing   Problem:  Coping: Goal: Level of anxiety will decrease 04/07/2020 1650 by Iantha Fallen, RN Outcome: Adequate for Discharge 04/07/2020 1650 by Iantha Fallen, RN Outcome: Progressing   Problem: Elimination: Goal: Will not experience complications related to bowel motility 04/07/2020 1650 by Iantha Fallen, RN Outcome: Adequate for Discharge 04/07/2020 1650 by Iantha Fallen, RN Outcome: Progressing Goal: Will not experience complications related to urinary retention 04/07/2020 1650 by Iantha Fallen, RN Outcome: Adequate for Discharge 04/07/2020 1650 by Iantha Fallen, RN Outcome: Progressing   Problem: Pain Managment: Goal: General experience of comfort will improve 04/07/2020 1650 by Iantha Fallen, RN Outcome: Adequate for Discharge 04/07/2020 1650 by Iantha Fallen, RN Outcome: Progressing   Problem: Safety: Goal: Ability to remain free from injury will improve 04/07/2020 1650 by Iantha Fallen, RN Outcome: Adequate for Discharge 04/07/2020 1650 by Iantha Fallen, RN Outcome: Progressing   Problem: Skin Integrity: Goal: Risk for impaired skin integrity will decrease 04/07/2020 1650 by Iantha Fallen, RN Outcome: Adequate for Discharge 04/07/2020 1650 by Iantha Fallen, RN Outcome: Progressing

## 2020-04-07 NOTE — Discharge Instructions (Signed)
Laparoscopic Appendectomy, Adult, Care After This sheet gives you information about how to care for yourself after your procedure. Your health care provider may also give you more specific instructions. If you have problems or questions, contact your health care provider. What can I expect after the procedure? After the procedure, it is common to have:  Little energy for normal activities.  Mild pain in the area where the incisions were made.  Difficulty passing stool (constipation). This can be caused by: ? Pain medicine. ? A decrease in your activity. Follow these instructions at home: Medicines  Take over-the-counter and prescription medicines only as told by your health care provider.  If you were prescribed an antibiotic medicine, take it as told by your health care provider. Do not stop taking the antibiotic even if you start to feel better.  Do not drive or use heavy machinery while taking prescription pain medicine.  Ask your health care provider if the medicine prescribed to you can cause constipation. You may need to take steps to prevent or treat constipation, such as: ? Drink enough fluid to keep your urine pale yellow. ? Take over-the-counter or prescription medicines. ? Eat foods that are high in fiber, such as beans, whole grains, and fresh fruits and vegetables. ? Limit foods that are high in fat and processed sugars, such as fried or sweet foods. Incision care   Follow instructions from your health care provider about how to take care of your incisions. Make sure you: ? Wash your hands with soap and water before and after you change your bandage (dressing). If soap and water are not available, use hand sanitizer. ? Change your dressing as told by your health care provider. ? Leave stitches (sutures), skin glue, or adhesive strips in place. These skin closures may need to stay in place for 2 weeks or longer. If adhesive strip edges start to loosen and curl up, you may  trim the loose edges. Do not remove adhesive strips completely unless your health care provider tells you to do that.  Check your incision areas every day for signs of infection. Check for: ? Redness, swelling, or pain. ? Fluid or blood. ? Warmth. ? Pus or a bad smell. Bathing  Keep your incisions clean and dry. Clean them as often as told by your health care provider. To do this: 1. Gently wash the incisions with soap and water. 2. Rinse the incisions with water to remove all soap. 3. Pat the incisions dry with a clean towel. Do not rub the incisions.  Do not take baths, swim, or use a hot tub for 2 weeks, or until your health care provider approves. You may take showers after 48 hours. Activity   Do not drive for 24 hours if you were given a sedative during your procedure.  Rest after the procedure. Return to your normal activities as told by your health care provider. Ask your health care provider what activities are safe for you.  For 3 weeks, or for as long as told by your health care provider: ? Do not lift anything that is heavier than 10 lb (4.5 kg), or the limit that you are told. ? Do not play contact sports. General instructions  If you were sent home with a drain, follow instructions from your health care provider about how to care for it.  Take deep breaths. This helps to prevent your lungs from developing an infection (pneumonia).  Keep all follow-up visits as told by your health   care provider. This is important. Contact a health care provider if:  You have redness, swelling, or pain around an incision.  You have fluid or blood coming from an incision.  Your incision feels warm to the touch.  You have pus or a bad smell coming from an incision or dressing.  Your incision edges break open after your sutures have been removed.  You have increasing pain in your shoulders.  You feel dizzy or you faint.  You develop shortness of breath.  You keep feeling  nauseous or you are vomiting.  You have diarrhea or you cannot control your bowel functions.  You lose your appetite.  You develop swelling or pain in your legs.  You develop a rash. Get help right away if you have:  A fever.  Difficulty breathing.  Sharp pains in your chest. Summary  After a laparoscopic appendectomy, it is common to have little energy for normal activities, mild pain in the area of the incisions, and constipation.  Infection is the most common complication after this procedure. Follow your health care provider's instructions about caring for yourself after the procedure.  Rest after the procedure. Return to your normal activities as told by your health care provider.  Contact your health care provider if you notice signs of infection around your incisions or you develop shortness of breath. Get help right away if you have a fever, chest pain, or difficulty breathing. This information is not intended to replace advice given to you by your health care provider. Make sure you discuss any questions you have with your health care provider. Document Revised: 03/24/2018 Document Reviewed: 03/24/2018 Elsevier Patient Education  2020 [employer]  

## 2020-04-07 NOTE — Plan of Care (Signed)

## 2020-04-07 NOTE — Discharge Summary (Signed)
Physician Discharge Summary  Patient ID: Heidi Pace MRN: [id number] DOB/AGE: 1965-10-16 54 y.o.  Admit date: 04/06/2020 Discharge date: 04/07/2020  Admission Diagnoses: acute appendicitis  Discharge Diagnoses:  Active Problems:   Acute appendicitis   Discharged Condition: good  Hospital Course: Pt did well overnight.  Consults: None  Significant Diagnostic Studies: labs: cbc, bmet  Treatments: IV hydration and surgery: lap appy  Discharge Exam: Blood pressure (!) 146/61, pulse 67, temperature 98.1 F (36.7 C), temperature source Oral, resp. rate 18, height 5\' 4"  (1.626 m), weight 124.3 kg, SpO2 94 %. General appearance: alert and cooperative GI: soft, appropriately tender Incision/Wound: clean, dry, intact  Disposition:  There are no questions and answers to display.         Allergies as of 04/07/2020   No Known Allergies     Medication List    TAKE these medications   Bismuth 262 MG Chew Chew 1 tablet by mouth in the morning, at noon, in the evening, and at bedtime. What changed:   when to take this  reasons to take this   buPROPion 150 MG 24 hr tablet Commonly known as: WELLBUTRIN XL Take 150 mg by mouth every morning.   CALCIUM CITRATE PO Take 500 mg by mouth 3 (three) times daily. Chewables   docusate sodium 100 MG capsule Commonly known as: COLACE Take 1 capsule (100 mg total) by mouth 2 (two) times daily.   FLUoxetine 20 MG capsule Commonly known as: PROZAC Take 20 mg by mouth every morning.   gabapentin 100 MG capsule Commonly known as: NEURONTIN Take 2 capsules (200 mg total) by mouth every 12 (twelve) hours.   HYDROcodone-acetaminophen 5-325 MG tablet Commonly known as: NORCO/VICODIN Take 1-2 tablets by mouth every 6 (six) hours as needed for moderate pain.   levothyroxine 25 MCG tablet Commonly known as: SYNTHROID Take 25 mcg by mouth daily before breakfast.   lisinopril-hydrochlorothiazide 10-12.5 MG tablet Commonly known as:  ZESTORETIC Take 1 tablet by mouth daily.   multivitamin tablet Take 1 tablet by mouth daily.   omeprazole 20 MG capsule Commonly known as: PRILOSEC Take 20 mg by mouth daily.   ondansetron 4 MG disintegrating tablet Commonly known as: ZOFRAN-ODT Take 1 tablet (4 mg total) by mouth every 6 (six) hours as needed for nausea or vomiting.       Follow-up Information    Kinsinger, 06/08/2020, MD Follow up.   Specialty: General Surgery Why: as scheduled Contact information: [address] [place] [address] [place] [phone]               Signed: [phone] 04/07/2020, 8:35 AM

## 2020-04-09 LAB — SURGICAL PATHOLOGY

## 2020-04-24 ENCOUNTER — Encounter: Payer: Commercial Managed Care - PPO | Attending: General Surgery | Admitting: Skilled Nursing Facility1

## 2020-04-24 ENCOUNTER — Other Ambulatory Visit: Payer: Self-pay

## 2020-04-24 DIAGNOSIS — E669 Obesity, unspecified: Secondary | ICD-10-CM

## 2020-04-24 NOTE — Progress Notes (Signed)
Bariatric Nutrition Follow-Up Visit Medical Nutrition Therapy   2 Months Post-Operative   Pt given star previously: no  NUTRITION ASSESSMENT    Anthropometrics  Surgery date: 02/26/2020 Surgery type: RYGB Start weight at [facility]: 299 Weight today: 262.8  Body Composition Scale 04/24/2020  Weight  lbs 262.8  Total Body Fat  % 47.3     Visceral Fat 19  Fat-Free Mass  % 52.6     Total Body Water  % 40.8     Muscle-Mass  lbs 30.8  BMI 45  Body Fat Displacement ---        Torso  lbs 77.1        Left Leg  lbs 15.4        Right Leg  lbs 15.4        Left Arm  lbs 7.7        Right Arm  lbs 7.7   Clinical  Medical hx:  Medications:  Labs:    Lifestyle & Dietary Hx  Pt states she had apendicits with appendectomy (04/06/2020). Pt states she is feeling very tired.  Pt state she ate potato and crackers due to needing to feel better with appendicitis with no issue.     Estimated daily fluid intake: 64+ oz Estimated daily protein intake: 30 g Supplements: multi + calcium citrate  Current average weekly physical activity: ADL's will start back on her bike   24-Hr Dietary Recall: 4 hours in between  First Meal: greek yogurt (about half)  Snack:  Second Meal: 1.5 ounces ground beef + cheese  Snack:  Third Meal (6-6:30): 1.5 ounces beef roast  Snack: sugar free popcilce  Beverages: water   Post-Op Goals/ Signs/ Symptoms Using straws: no Drinking while eating: no Chewing/swallowing difficulties: no Changes in vision: no Changes to mood/headaches: no Hair loss/changes to skin/nails: no Difficulty focusing/concentrating: no Sweating: no Dizziness/lightheadedness: no Palpitations: on  Carbonated/caffeinated beverages: no N/V/D/C/Gas: diarrhea with beans Abdominal pain: no Dumping syndrome: no    NUTRITION DIAGNOSIS  Overweight/obesity ([place]-3.3) related to past poor dietary habits and physical inactivity as evidenced by completed bariatric surgery and following dietary  guidelines for continued weight loss and healthy nutrition status.     NUTRITION INTERVENTION Nutrition counseling (C-1) and education (E-2) to facilitate bariatric surgery goals, including: . Diet advancement to the next phase (phase 4) now including non starchy vegetables  . The importance of consuming adequate calories as well as certain nutrients daily due to the body's need for essential vitamins, minerals, and fats . The importance of daily physical activity and to reach a goal of at least 150 minutes of moderate to vigorous physical activity weekly (or as directed by their physician) due to benefits such as increased musculature and improved lab values . The importance of intuitive eating specifically learning hunger-satiety cues and understanding the importance of learning a new body  Goals: Drink at least 1 protein drink per day (30 grams) Try meatless options: gardein, morning star farms, impossible  -Continue to aim for a minimum of 64 fluid ounces 7 days a week with at least 30 ounces being plain water -Eat non-starchy vegetables 2 times a day 7 days a week -Start out with soft cooked vegetables today and tomorrow; if tolerated begin to eat raw vegetables or cooked including salads -Eat your 3 ounces of protein first then start in on your non-starchy vegetables; once you understand how much of your meal leads to satisfaction and not full while still eating 3 ounces of  protein and non-starchy vegetables you can eat them in any order  -Continue to aim for 30 minutes of activity at least 5 times a week -Do NOT cook with/add to your food: alfredo sauce, cheese sauce, barbeque sauce, ketchup, fat back, butter, bacon grease, grease, Crisco, OR SUGAR   Handouts Provided Include   Phase 4  Learning Style & Readiness for Change Teaching method utilized: Visual & Auditory  Demonstrated degree of understanding via: Teach Back  Barriers to learning/adherence to lifestyle change: none  identified   RD's Notes for Next Visit . Assess adherence to pt chosen goals    MONITORING & EVALUATION Dietary intake, weekly physical activity, body weight  Next Steps Patient is to follow-up in 3 months

## 2020-05-28 ENCOUNTER — Other Ambulatory Visit: Payer: Self-pay

## 2020-05-28 ENCOUNTER — Encounter: Payer: Self-pay | Admitting: Dietician

## 2020-05-28 ENCOUNTER — Encounter: Payer: Commercial Managed Care - PPO | Attending: General Surgery | Admitting: Dietician

## 2020-05-28 DIAGNOSIS — E669 Obesity, unspecified: Secondary | ICD-10-CM | POA: Diagnosis not present

## 2020-05-28 NOTE — Progress Notes (Signed)
Bariatric Nutrition Follow-Up Visit Medical Nutrition Therapy  Appt Start Time: 4:25pm    End Time: 4:45pm  3 Months Post-Operative RYGB Surgery Surgery Date: 02/26/2020  Pt expectation of surgery: to lose enough weight to qualify for knee surgery, to be healthier    NUTRITION ASSESSMENT  Anthropometrics  Start weight at [facility]: 299 lbs (date: 11/30/2019) Today's weight: 249.8 lbs  Body Composition Scale 04/24/2020 05/28/2020  Weight  lbs 262.8 249.8  BMI 45 42.8  Total Body Fat  % 47.3 46.1     Visceral Fat 19 17  Fat-Free Mass  % 52.6 53.8     Total Body Water  % 40.8 41.4     Muscle-Mass  lbs 30.8 30.7  Body Fat Displacement --- ---         Torso  lbs 77.1 71.5         Left Leg  lbs 15.4 14.3         Right Leg  lbs 15.4 14.3         Left Arm  lbs 7.7 7.1         Right Arm  lbs 7.7 7.1    Lifestyle & Dietary Hx Patient states she does well with drinking enough fluids each day (sticks to water), but is having trouble getting in enough protein. States she is full after 2-3 bites, getting in an estimated ~12 grams of protein per meal. Eats 3 times per day, no snacks other than a sugar-free popsicle in the evening. Has protein shakes she likes but tries not to use/rely on them.    24-Hr Dietary Recall First Meal: 1 1/2 slices [place] bacon  Snack: -  Second Meal: grilled chicken + peppers/onion  Snack: -  Third Meal: grilled fish  Snack: sugar-free popsicle  Beverages: water  Estimated daily fluid intake: 64+ oz Estimated daily protein intake: 45 g Supplements: does not tolerate Celebrate or Opurity capsules well Current average weekly physical activity: stationary bike 30 mins/day and    Post-Op Goals/ Signs/ Symptoms Using straws: no Drinking while eating: no Chewing/swallowing difficulties: no Changes in vision: no Changes to mood/headaches: no Hair loss/changes to skin/nails: no Difficulty focusing/concentrating: no Sweating: no Dizziness/lightheadedness:  no Palpitations: no  Carbonated/caffeinated beverages: no N/V/D/C/Gas: no Abdominal pain: no Dumping syndrome: no   NUTRITION DIAGNOSIS  Overweight/obesity ([place]-3.3) related to past poor dietary habits and physical inactivity as evidenced by completed bariatric surgery and following dietary guidelines for continued weight loss and healthy nutrition status.   NUTRITION INTERVENTION Nutrition counseling (C-1) and education (E-2) to facilitate bariatric surgery goals, including: . The importance of consuming adequate calories as well as certain nutrients daily due to the body's need for essential vitamins, minerals, and fats . The importance of daily physical activity and to reach a goal of at least 150 minutes of moderate to vigorous physical activity weekly (or as directed by their physician) due to benefits such as increased musculature and improved lab values  Handouts Provided Include   Protein2O sample  Learning Style & Readiness for Change Teaching method utilized: Visual & Auditory  Demonstrated degree of understanding via: Teach Back  Barriers to learning/adherence to lifestyle change: None Identified    MONITORING & EVALUATION Dietary intake, weekly physical activity, body weight, and goals in ~3 months.  Next Steps Patient is to follow-up in 3 months for 6 month post-op follow-up.

## 2020-09-17 ENCOUNTER — Encounter: Payer: Commercial Managed Care - PPO | Attending: General Surgery | Admitting: Skilled Nursing Facility1

## 2020-09-17 ENCOUNTER — Other Ambulatory Visit: Payer: Self-pay

## 2020-09-17 DIAGNOSIS — E669 Obesity, unspecified: Secondary | ICD-10-CM | POA: Diagnosis present

## 2020-09-18 NOTE — Progress Notes (Signed)
Follow-up visit:  Post-Operative RYGB Surgery  Medical Nutrition Therapy:  Appt start time: 6:00pm end time:  7:00pm  Primary concerns today: Post-operative Bariatric Surgery Nutrition Management 6 Month Post-Op Class  Anthropometrics  Start weight at [facility]: 299 lbs (date: 11/30/2019) Today's weight: 216.8 lbs  Body Composition Scale 04/24/2020 05/28/2020  Weight  lbs 262.8 249.8  BMI 45 42.8  Total Body Fat  % 47.3 46.1     Visceral Fat 19 17  Fat-Free Mass  % 52.6 53.8     Total Body Water  % 40.8 41.4     Muscle-Mass  lbs 30.8 30.7  Body Fat Displacement --- ---         Torso  lbs 77.1 71.5         Left Leg  lbs 15.4 14.3         Right Leg  lbs 15.4 14.3         Left Arm  lbs 7.7 7.1         Right Arm  lbs 7.7 7.1     Information Reviewed/ Discussed During Appointment: -Review of composition scale numbers -Fluid requirements (64-100 ounces) -Protein requirements (60-80g) -Strategies for tolerating diet -Advancement of diet to include Starchy vegetables -Barriers to inclusion of new foods -Inclusion of appropriate multivitamin and calcium supplements  -Exercise recommendations   Fluid intake: adequate   Medications: See List Supplementation: appropriate   Using straws: no Drinking while eating: no Having you been chewing well: yes Chewing/swallowing difficulties: no Changes in vision: no Changes to mood/headaches: no Hair loss/[place] to skin/Changes to nails: no Any difficulty focusing or concentrating: no Sweating: no Dizziness/Lightheaded: no Palpitations: no  Carbonated beverages: no N/V/D/C/GAS: no Abdominal Pain: no Dumping syndrome: no  Recent physical activity:  ADL's  Progress Towards Goal(s):  In Progress Teaching method utilized: Visual & Auditory  Demonstrated degree of understanding via: Teach Back  Readiness Level: Action Barriers to learning/adherence to lifestyle change: none identified  Handouts given during visit include:  Phase V  diet Progression   Goals Sheet  The Benefits of Exercise are endless.....  Support Group Topics  Pt Chosen Goals: Nutrition Goals: I will eat (a number) _1_____ new non-starchy vegetables every week by (specific date) ____3/1/22__________  I will stand up and stretch for each commercial while watching television by (specific date) _3/1/22_________  Teaching Method Utilized:  Visual Auditory Hands on  Demonstrated degree of understanding via:  Teach Back   Monitoring/Evaluation:  Dietary intake, exercise, and body weight. Follow up in 3 months for 9 month post-op visit.

## 2021-03-25 ENCOUNTER — Ambulatory Visit: Payer: Commercial Managed Care - PPO | Admitting: Skilled Nursing Facility1

## 2021-10-02 IMAGING — CT CT ABD-PELV W/ CM
2 of 5 series · 16 of 46 positions shown, 18 images · IV contrast (omnipaque)
Comparison: None.

CLINICAL DATA: Mid abdominal pain with nausea and vomiting x2 days.

EXAM:
CT ABDOMEN AND PELVIS WITH CONTRAST
TECHNIQUE: Multidetector CT imaging of the abdomen and pelvis was performed
using the standard protocol following bolus administration of
intravenous contrast.
CONTRAST:  100mL OMNIPAQUE IOHEXOL 300 MG/ML  SOLN

[Series 2: axial st · axial · 0.87mm/px · z∈[-524,-74]mm · 13 of 106 slices shown, 15 images]
[im 8/106  soft-tissue]
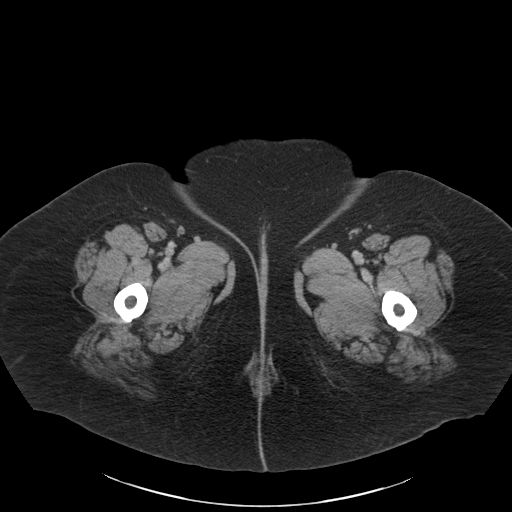
[im 8/106  bone]
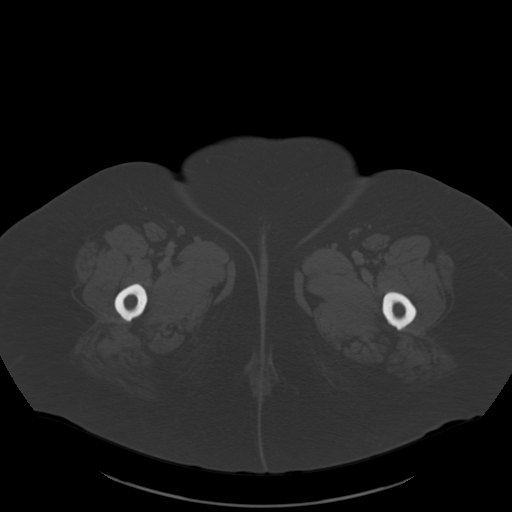
[im 16/106  soft-tissue]
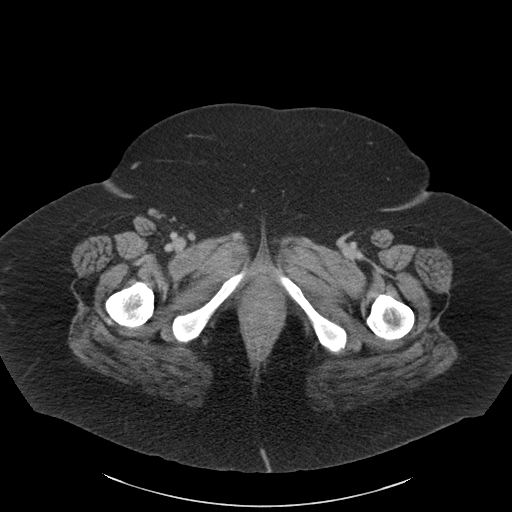
[im 23/106  soft-tissue]
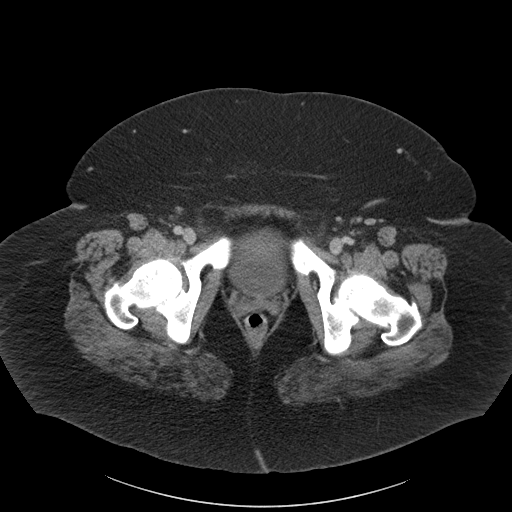
[im 31/106  soft-tissue]
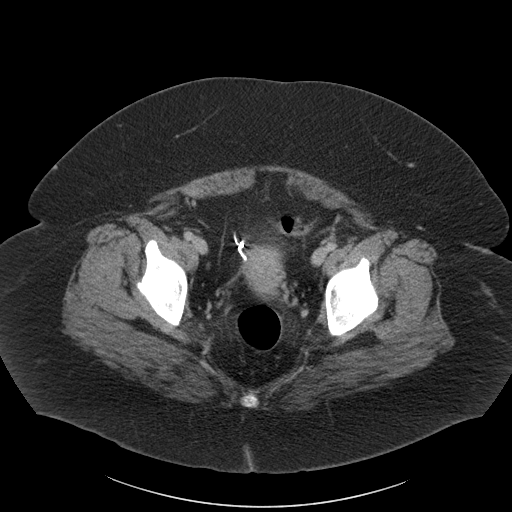
[im 38/106  soft-tissue]
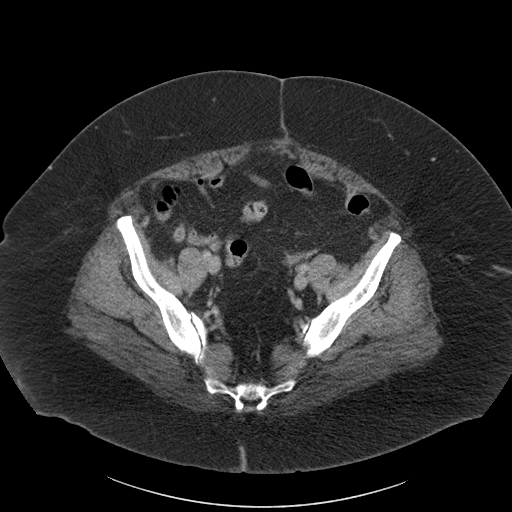
[im 46/106  soft-tissue]
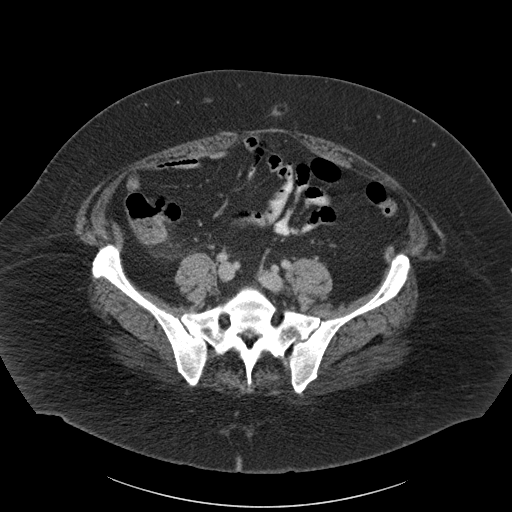
[im 53/106  soft-tissue]
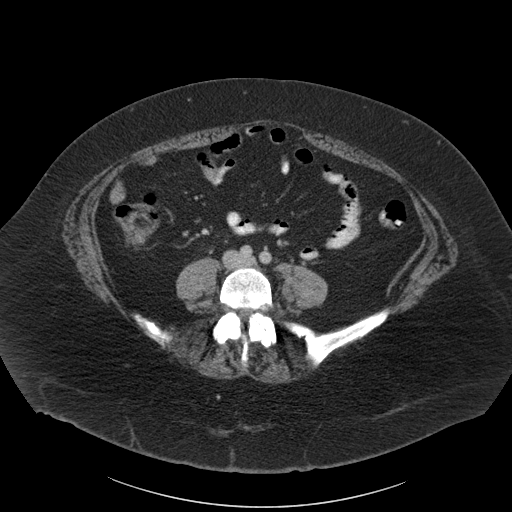
[im 61/106  soft-tissue]
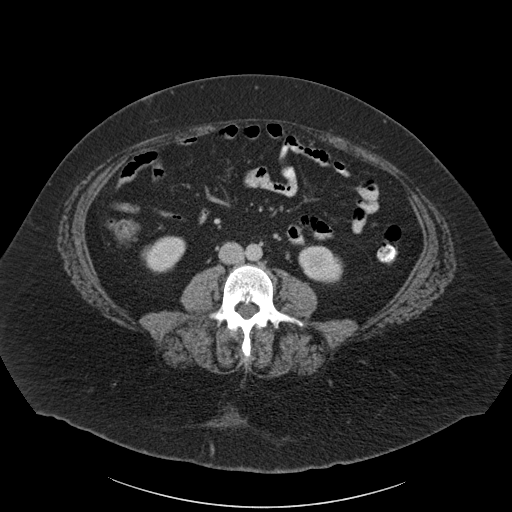
[im 68/106  soft-tissue]
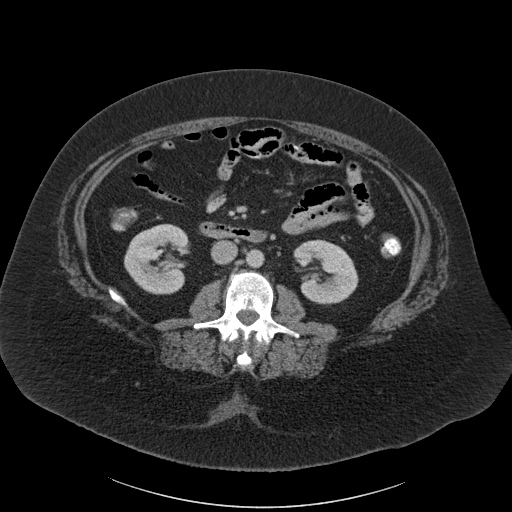
[im 68/106  bone]
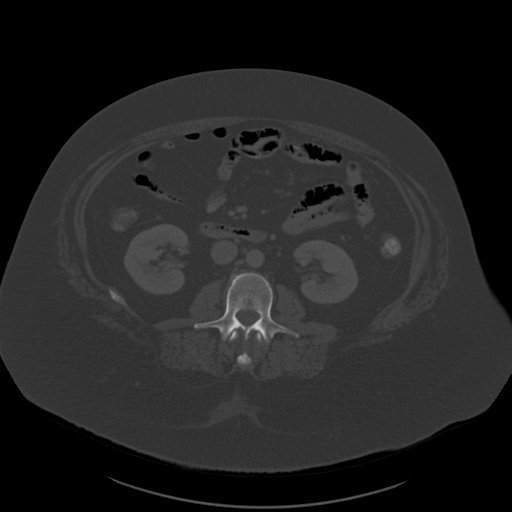
[im 76/106  soft-tissue]
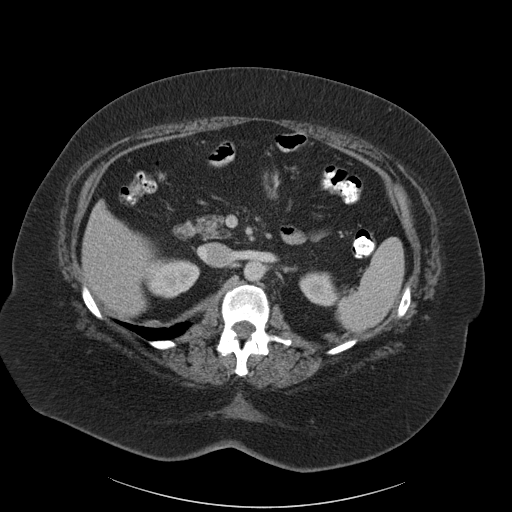
[im 83/106  soft-tissue]
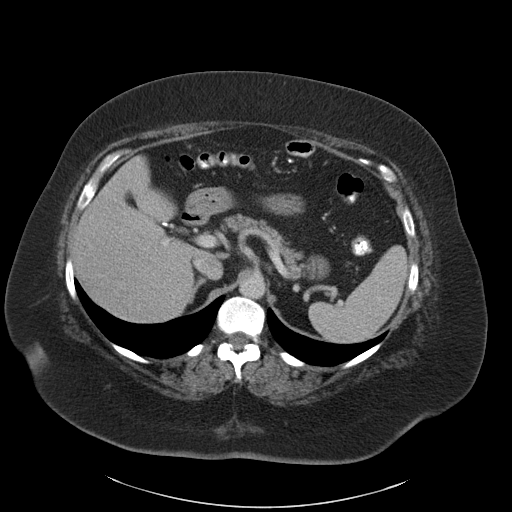
[im 91/106  soft-tissue]
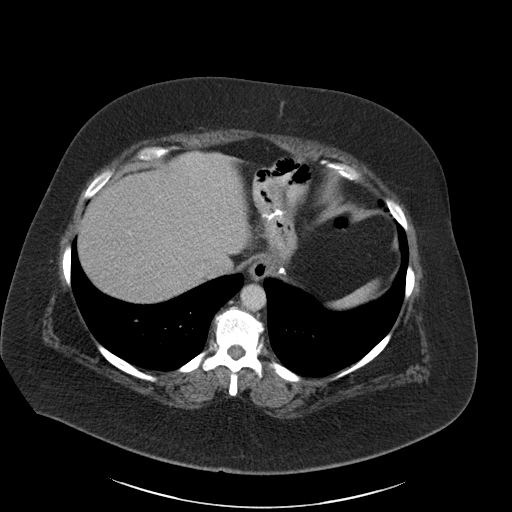
[im 98/106  soft-tissue]
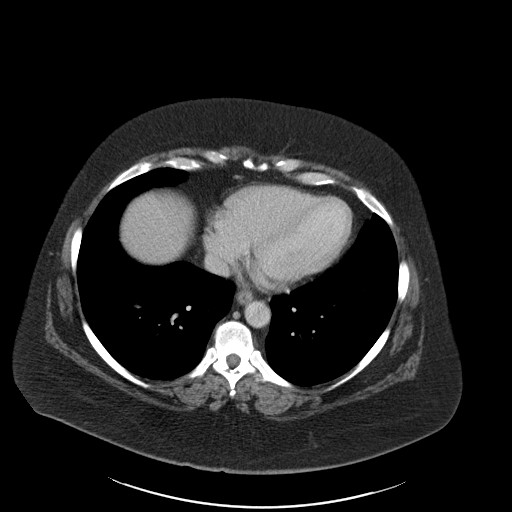

[Series 4: coronal st · coronal · 1.05mm/px · 3 of 158 slices shown]
[im 53/158  soft-tissue]
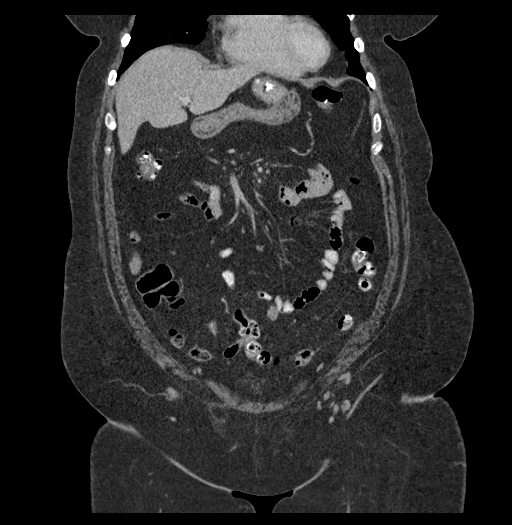
[im 70/158  soft-tissue]
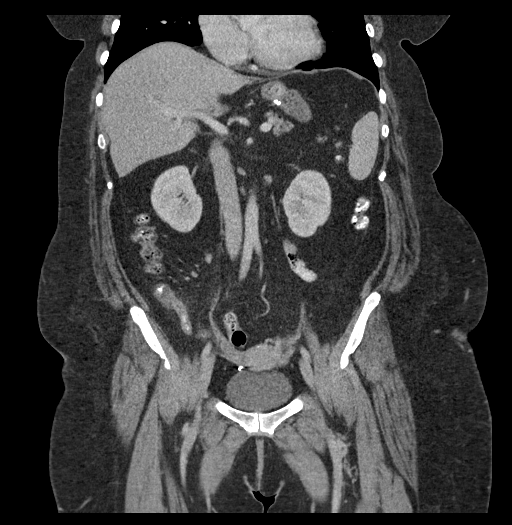
[im 88/158  soft-tissue]
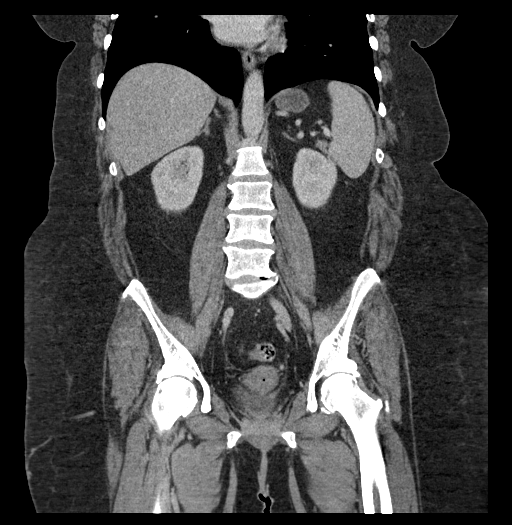

[16 of 46 positions shown; findings below may reference images not displayed]

FINDINGS: Lower chest: No acute abnormality.

Hepatobiliary: No focal liver abnormality is seen. Status post
cholecystectomy. No biliary dilatation.

Pancreas: Unremarkable. No pancreatic ductal dilatation or
surrounding inflammatory changes.

Spleen: Normal in size without focal abnormality.

Adrenals/Urinary Tract: Adrenal glands are unremarkable. Kidneys are
normal, without renal calculi, focal lesion, or hydronephrosis.
Bladder is unremarkable.

Stomach/Bowel: Surgical sutures are seen within the gastric region.
A 4 mm appendicolith is seen within the lumen of the appendix. Very
mild inflammation of the base of the appendix is seen with mild
periappendiceal inflammatory fat stranding. No evidence of bowel
dilatation.

Vascular/Lymphatic: No significant vascular findings are present. No
enlarged abdominal or pelvic lymph nodes.

Reproductive: The uterus is normal in appearance. Curvilinear suture
like material is seen overlying the right adnexa.

Other: No abdominal wall hernia or abnormality. No abdominopelvic
ascites.

Musculoskeletal: No acute or significant osseous findings.
IMPRESSION: 1. Findings consistent with mild appendicitis.
2. Evidence of prior gastric bypass surgery.
3. Evidence of prior cholecystectomy.

## 2022-09-18 ENCOUNTER — Encounter (HOSPITAL_COMMUNITY): Payer: Self-pay | Admitting: *Deleted

## 2023-09-23 ENCOUNTER — Encounter (HOSPITAL_COMMUNITY): Payer: Self-pay | Admitting: *Deleted

## 2024-10-09 ENCOUNTER — Encounter (HOSPITAL_COMMUNITY): Payer: Self-pay | Admitting: *Deleted
